# Patient Record
Sex: Male | Born: 1985 | Race: White | Hispanic: No | Marital: Married | Smoking: Never smoker
Health system: Southern US, Community
[De-identification: ages and names within clinical notes are randomized; demographics above are authoritative.]

## PROBLEM LIST (undated history)

## (undated) ENCOUNTER — Ambulatory Visit: Source: Home / Self Care

## (undated) HISTORY — PX: LITHOTRIPSY: SUR834

---
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
## 2011-05-20 ENCOUNTER — Other Ambulatory Visit (HOSPITAL_COMMUNITY): Payer: Self-pay | Admitting: Gastroenterology

## 2011-05-30 ENCOUNTER — Other Ambulatory Visit (HOSPITAL_COMMUNITY): Payer: Self-pay

## 2011-05-30 ENCOUNTER — Ambulatory Visit (HOSPITAL_COMMUNITY)
Admission: RE | Admit: 2011-05-30 | Discharge: 2011-05-30 | Disposition: A | Discharge status: Home / Self Care | Payer: 59 | Source: Ambulatory Visit | Attending: Gastroenterology | Admitting: Gastroenterology

## 2011-05-30 DIAGNOSIS — R109 Unspecified abdominal pain: Secondary | ICD-10-CM | POA: Insufficient documentation

## 2011-05-30 DIAGNOSIS — Q438 Other specified congenital malformations of intestine: Secondary | ICD-10-CM | POA: Insufficient documentation

## 2011-05-30 DIAGNOSIS — R197 Diarrhea, unspecified: Secondary | ICD-10-CM | POA: Insufficient documentation

## 2011-06-03 ENCOUNTER — Other Ambulatory Visit (HOSPITAL_COMMUNITY): Payer: Self-pay

## 2011-06-03 ENCOUNTER — Other Ambulatory Visit (HOSPITAL_COMMUNITY): Payer: Self-pay | Admitting: Gastroenterology

## 2011-06-03 ENCOUNTER — Ambulatory Visit (HOSPITAL_COMMUNITY)
Admission: RE | Admit: 2011-06-03 | Discharge: 2011-06-03 | Disposition: A | Discharge status: Home / Self Care | Payer: 59 | Source: Ambulatory Visit | Attending: Gastroenterology | Admitting: Gastroenterology

## 2011-06-03 DIAGNOSIS — R109 Unspecified abdominal pain: Secondary | ICD-10-CM | POA: Insufficient documentation

## 2011-06-03 DIAGNOSIS — R197 Diarrhea, unspecified: Secondary | ICD-10-CM | POA: Insufficient documentation

## 2011-06-03 DIAGNOSIS — K449 Diaphragmatic hernia without obstruction or gangrene: Secondary | ICD-10-CM | POA: Insufficient documentation

## 2016-03-30 DIAGNOSIS — J0100 Acute maxillary sinusitis, unspecified: Secondary | ICD-10-CM | POA: Diagnosis not present

## 2016-06-15 DIAGNOSIS — J069 Acute upper respiratory infection, unspecified: Secondary | ICD-10-CM | POA: Diagnosis not present

## 2016-06-15 DIAGNOSIS — R0789 Other chest pain: Secondary | ICD-10-CM | POA: Diagnosis not present

## 2017-06-10 DIAGNOSIS — Z20828 Contact with and (suspected) exposure to other viral communicable diseases: Secondary | ICD-10-CM | POA: Diagnosis not present

## 2017-07-09 DIAGNOSIS — J0100 Acute maxillary sinusitis, unspecified: Secondary | ICD-10-CM | POA: Diagnosis not present

## 2017-08-06 DIAGNOSIS — M25512 Pain in left shoulder: Secondary | ICD-10-CM | POA: Diagnosis not present

## 2017-11-26 DIAGNOSIS — J019 Acute sinusitis, unspecified: Secondary | ICD-10-CM | POA: Diagnosis not present

## 2018-01-28 ENCOUNTER — Emergency Department (HOSPITAL_COMMUNITY)
Admission: EM | Admit: 2018-01-28 | Discharge: 2018-01-28 | Disposition: A | Discharge status: Home / Self Care | Payer: 59 | Attending: Emergency Medicine | Admitting: Emergency Medicine

## 2018-01-28 ENCOUNTER — Emergency Department (HOSPITAL_COMMUNITY): Payer: 59

## 2018-01-28 ENCOUNTER — Encounter (HOSPITAL_COMMUNITY): Payer: Self-pay

## 2018-01-28 DIAGNOSIS — N200 Calculus of kidney: Secondary | ICD-10-CM

## 2018-01-28 DIAGNOSIS — N202 Calculus of kidney with calculus of ureter: Secondary | ICD-10-CM | POA: Diagnosis not present

## 2018-01-28 DIAGNOSIS — R109 Unspecified abdominal pain: Secondary | ICD-10-CM | POA: Diagnosis present

## 2018-01-28 LAB — URINALYSIS, ROUTINE W REFLEX MICROSCOPIC
BILIRUBIN URINE: NEGATIVE
Glucose, UA: NEGATIVE mg/dL
KETONES UR: NEGATIVE mg/dL
LEUKOCYTES UA: NEGATIVE
NITRITE: NEGATIVE
PROTEIN: NEGATIVE mg/dL
SPECIFIC GRAVITY, URINE: 1.012 (ref 1.005–1.030)
pH: 7 (ref 5.0–8.0)

## 2018-01-28 LAB — I-STAT CHEM 8, ED
BUN: 19 mg/dL (ref 6–20)
CALCIUM ION: 1.17 mmol/L (ref 1.15–1.40)
Chloride: 107 mmol/L (ref 98–111)
Creatinine, Ser: 1.1 mg/dL (ref 0.61–1.24)
Glucose, Bld: 117 mg/dL — ABNORMAL HIGH (ref 70–99)
HEMATOCRIT: 42 % (ref 39.0–52.0)
HEMOGLOBIN: 14.3 g/dL (ref 13.0–17.0)
Potassium: 3.9 mmol/L (ref 3.5–5.1)
Sodium: 141 mmol/L (ref 135–145)
TCO2: 27 mmol/L (ref 22–32)

## 2018-01-28 MED ORDER — HYDROCODONE-ACETAMINOPHEN 5-325 MG PO TABS: 1.0000 | ORAL_TABLET | ORAL | 0 refills | Status: DC | PRN

## 2018-01-28 MED ORDER — TAMSULOSIN HCL 0.4 MG PO CAPS: 0.4000 mg | ORAL_CAPSULE | Freq: Every day | ORAL | 0 refills | Status: DC

## 2018-01-28 MED ORDER — ONDANSETRON 4 MG PO TBDP: ORAL_TABLET | ORAL | 0 refills | Status: DC

## 2018-01-28 NOTE — ED Notes (Addendum)
Pt aware of need for urine sample, urinal at bedside 

## 2018-01-28 NOTE — ED Triage Notes (Signed)
Onset 2 weeks intermittant left flank radiating to left abd into testicle.  No vomiting.  Heating pad relieves pain.  Sent from u/c for CT scan to r/o stones.  Took Advil x 4 one hour PTA, pain is relieved.

## 2018-01-28 NOTE — ED Provider Notes (Signed)
[facility] EMERGENCY DEPARTMENT Provider Note   CSN: [id number] Arrival date & time: 01/28/18  1131     History   Chief Complaint Chief Complaint  Patient presents with  . Flank Pain    HPI Brad Edwards is a 32 y.o. male.  Patient is a 33 year old male who presents with flank pain.  He states about 2 weeks ago he had some pain in his left back radiating to his left flank.  He states that resolved with heating pad.  It was coming and going for about 2 days but then went away.  He states that came back this morning with a vengeance.  He had pain that woke him up from sleep this morning in his left back radiating to his left mid and lower abdomen.  Also radiates to his left testicle.  He has no difficulty urinating.  No nausea or vomiting.  No fevers.  He went to an urgent care in [place] and was noted to have blood in his urine which is concerning for a kidney stone.  He was sent here for further evaluation.  He states that he took 4 ibuprofen prior to arrival and his pain has resolved since then.     History reviewed. No pertinent past medical history.  There are no active problems to display for this patient.   History reviewed. No pertinent surgical history.      Home Medications    Prior to Admission medications   Medication Sig Start Date End Date Taking? Authorizing Provider  HYDROcodone-acetaminophen (NORCO/VICODIN) 5-325 MG tablet Take 1-2 tablets by mouth every 4 (four) hours as needed. 01/28/18   Rolan Bucco, MD  ondansetron (ZOFRAN ODT) 4 MG disintegrating tablet 4mg  ODT q4 hours prn nausea/vomit 01/28/18   Rolan Bucco, MD  tamsulosin (FLOMAX) 0.4 MG CAPS capsule Take 1 capsule (0.4 mg total) by mouth daily. 01/28/18   Rolan Bucco, MD    Family History History reviewed. No pertinent family history.  Social History Social History   Tobacco Use  . Smoking status: Never Smoker  . Smokeless tobacco: Never Used  Substance Use  Topics  . Alcohol use: Yes    Comment: occ  . Drug use: Never     Allergies   Patient has no known allergies.   Review of Systems Review of Systems  Constitutional: Negative for chills, diaphoresis, fatigue and fever.  HENT: Negative for congestion, rhinorrhea and sneezing.   Eyes: Negative.   Respiratory: Negative for cough, chest tightness and shortness of breath.   Cardiovascular: Negative for chest pain and leg swelling.  Gastrointestinal: Positive for abdominal pain. Negative for blood in stool, diarrhea, nausea and vomiting.  Genitourinary: Negative for difficulty urinating, flank pain, frequency and hematuria.  Musculoskeletal: Negative for arthralgias and back pain.  Skin: Negative for rash.  Neurological: Negative for dizziness, speech difficulty, weakness, numbness and headaches.     Physical Exam Updated Vital Signs BP 128/90   Pulse (!) 101   Temp 97.9 F (36.6 C) (Oral)   Resp 16   SpO2 97%   Physical Exam  Constitutional: He is oriented to person, place, and time. He appears well-developed and well-nourished.  HENT:  Head: Normocephalic and atraumatic.  Eyes: Pupils are equal, round, and reactive to light.  Neck: Normal range of motion. Neck supple.  Cardiovascular: Normal rate, regular rhythm and normal heart sounds.  Pulmonary/Chest: Effort normal and breath sounds normal. No respiratory distress. He has no wheezes. He has no rales.  He exhibits no tenderness.  Abdominal: Soft. Bowel sounds are normal. There is tenderness (Mild tenderness to the left lower abdomen). There is no rebound and no guarding.  Genitourinary:  Genitourinary Comments: No pain on palpation of the left testicle or inguinal area  Musculoskeletal: Normal range of motion. He exhibits no edema.  Lymphadenopathy:    He has no cervical adenopathy.  Neurological: He is alert and oriented to person, place, and time.  Skin: Skin is warm and dry. No rash noted.  Psychiatric: He has a  normal mood and affect.     ED Treatments / Results  Labs (all labs ordered are listed, but only abnormal results are displayed) Labs Reviewed  URINALYSIS, ROUTINE W REFLEX MICROSCOPIC - Abnormal; Notable for the following components:      Result Value   Hgb urine dipstick MODERATE (*)    Bacteria, UA RARE (*)    All other components within normal limits  I-STAT CHEM 8, ED - Abnormal; Notable for the following components:   Glucose, Bld 117 (*)    All other components within normal limits    EKG None  Radiology Ct Renal Stone Study  Result Date: 01/28/2018 CLINICAL DATA:  Intermittent left flank pain over the last 2 weeks. EXAM: CT ABDOMEN AND PELVIS WITHOUT CONTRAST TECHNIQUE: Multidetector CT imaging of the abdomen and pelvis was performed following the standard protocol without IV contrast. COMPARISON:  None. FINDINGS: Lower chest: Normal Hepatobiliary: Normal Pancreas: Normal Spleen: Normal Adrenals/Urinary Tract: Adrenal glands are normal. Right kidney is normal. Left kidney shows mild swelling and hydroureteronephrosis to the level L4 where there is a 4 mm stone within the left ureter. There seems to be a second 2 mm stone in the distal left ureter a few mm proximal to the UVJ. No stone in the bladder. Stomach/Bowel: Normal Vascular/Lymphatic: Normal Reproductive: Normal Other: No free fluid or air. Musculoskeletal: Normal IMPRESSION: 4 mm stone in the midportion of the left ureter with mild left hydroureteronephrosis. Second 2 mm stone suspected in the distal left ureter just proximal to the UVJ. Electronically Signed   By: Paulina Fusi M.D.   On: 01/28/2018 12:49    Procedures Procedures (including critical care time)  Medications Ordered in ED Medications - No data to display   Initial Impression / Assessment and Plan / ED Course  I have reviewed the triage vital signs and the nursing notes.  Pertinent labs & imaging results that were available during my care of the  patient were reviewed by me and considered in my medical decision making (see chart for details).     Patient is a 32 year old male who presents with flank pain.  He has evidence of 4 mm left ureteral stone in the mid ureter as well as a smaller 2 mm stone behind that.  His creatinine is normal.  His pain is well controlled and has not required any pain medication in the ED.  He has no fever or suggestions of infection.  He was discharged home in good condition.  He was given a referral to follow-up with [facility].  He was given prescriptions for Vicodin, Flomax and Zofran.  Return precautions were given.  Final Clinical Impressions(s) / ED Diagnoses   Final diagnoses:  Kidney stone    ED Discharge Orders         Ordered    HYDROcodone-acetaminophen (NORCO/VICODIN) 5-325 MG tablet  Every 4 hours PRN     01/28/18 1352    ondansetron (ZOFRAN ODT)  4 MG disintegrating tablet     01/28/18 1352    tamsulosin (FLOMAX) 0.4 MG CAPS capsule  Daily     01/28/18 1352           Rolan Bucco, MD 01/28/18 1353

## 2018-12-31 ENCOUNTER — Other Ambulatory Visit: Payer: Self-pay

## 2018-12-31 DIAGNOSIS — Z20822 Contact with and (suspected) exposure to covid-19: Secondary | ICD-10-CM

## 2019-01-01 LAB — NOVEL CORONAVIRUS, NAA: SARS-CoV-2, NAA: NOT DETECTED

## 2020-05-05 IMAGING — CT CT RENAL STONE PROTOCOL
2 of 4 series · 16 of 46 positions shown, 18 images · non-contrast
Comparison: None.

CLINICAL DATA: Intermittent left flank pain over the last 2 weeks.

EXAM:
CT ABDOMEN AND PELVIS WITHOUT CONTRAST
TECHNIQUE: Multidetector CT imaging of the abdomen and pelvis was performed
following the standard protocol without IV contrast.

[Series 3: stone study 5.0 i30f 2 · axial · 0.98mm/px · z∈[+684,+1219]mm · 13 of 117 slices shown, 15 images]
[im 5/117  soft-tissue]
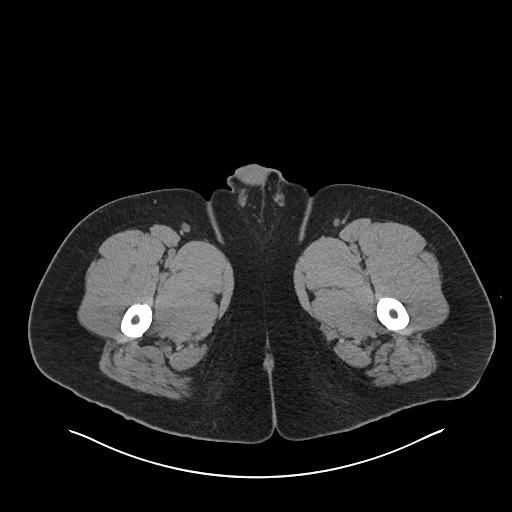
[im 5/117  bone]
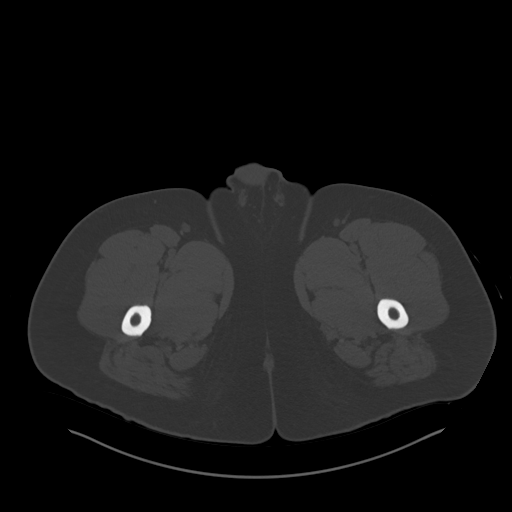
[im 14/117  soft-tissue]
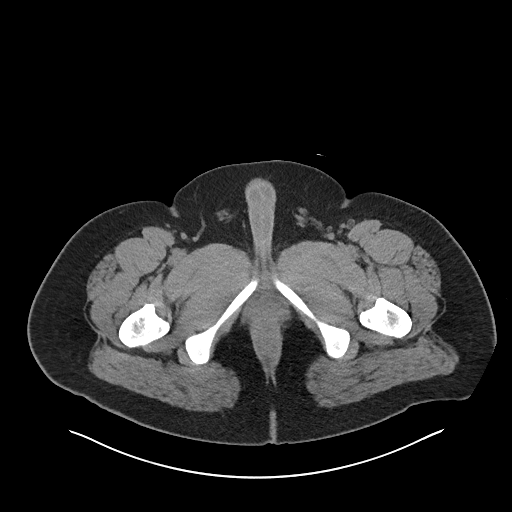
[im 24/117  soft-tissue]
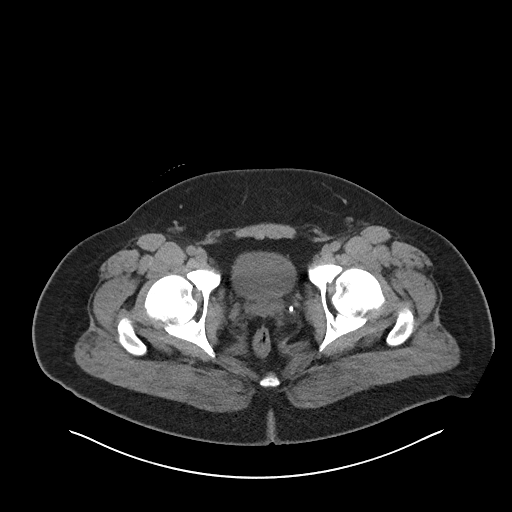
[im 33/117  soft-tissue]
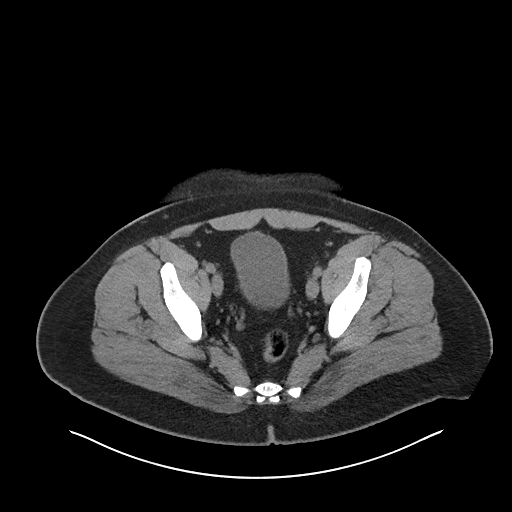
[im 42/117  soft-tissue]
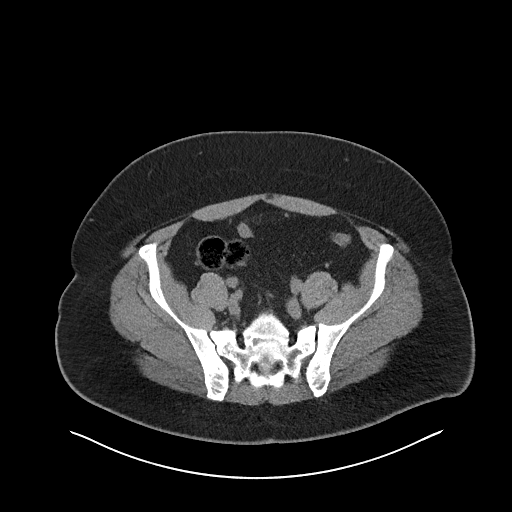
[im 52/117  soft-tissue]
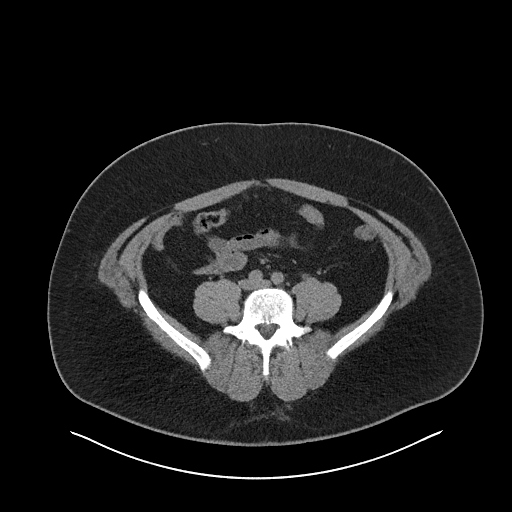
[im 61/117  soft-tissue]
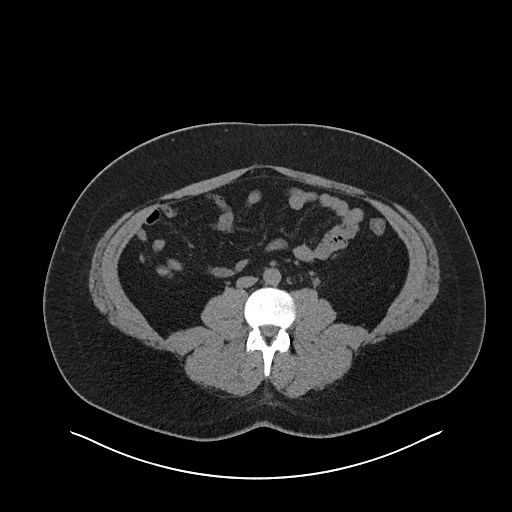
[im 65/117  soft-tissue]
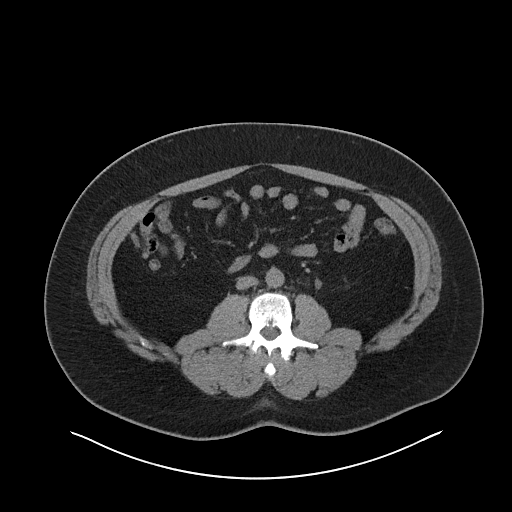
[im 75/117  soft-tissue]
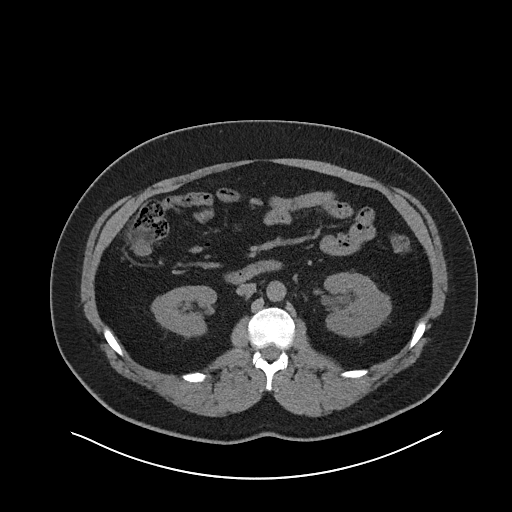
[im 75/117  bone]
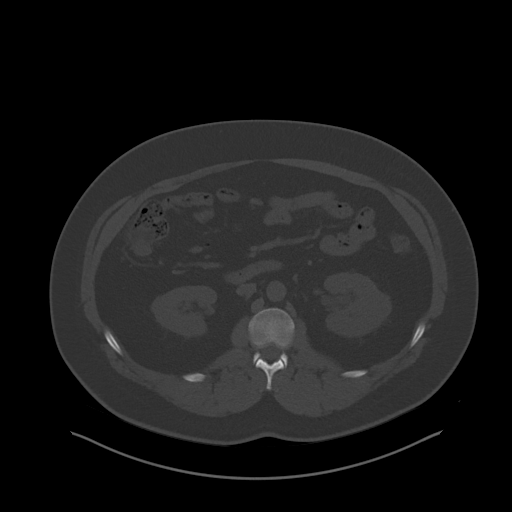
[im 84/117  soft-tissue]
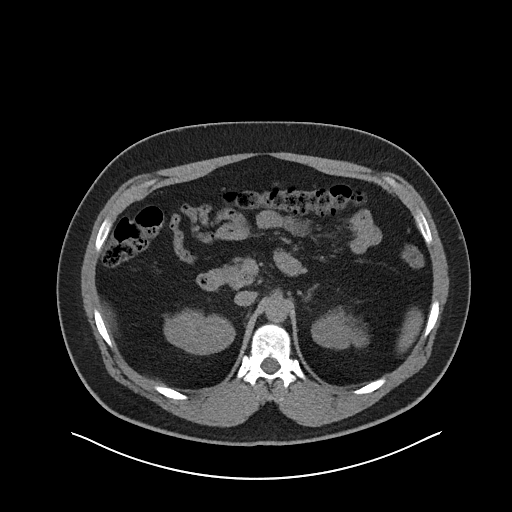
[im 93/117  soft-tissue]
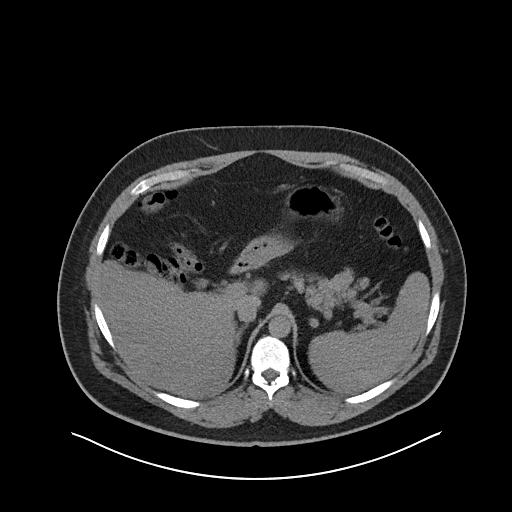
[im 103/117  soft-tissue]
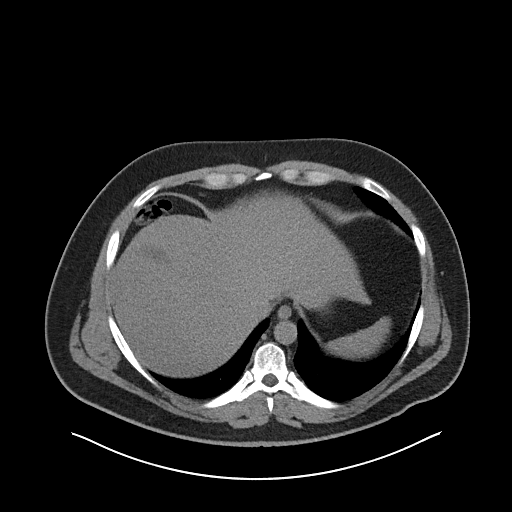
[im 112/117  soft-tissue]
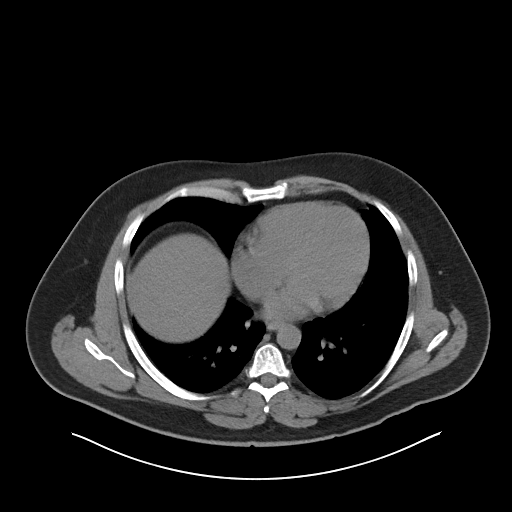

[Series 6: coronal soft tissue · coronal · 0.95mm/px · 3 of 125 slices shown]
[im 42/125  soft-tissue]
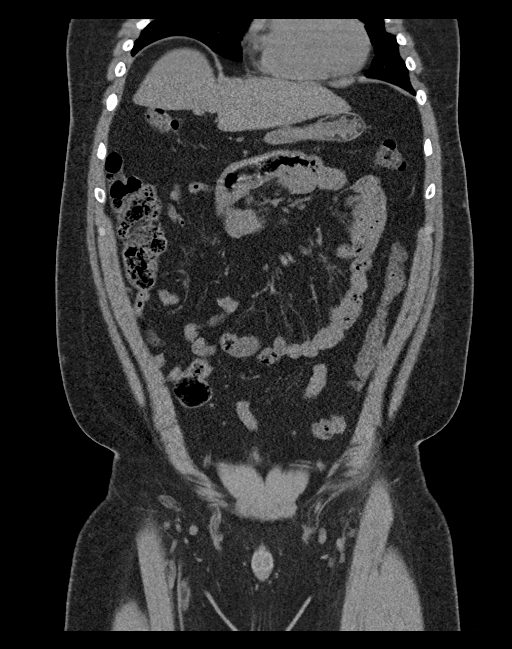
[im 56/125  soft-tissue]
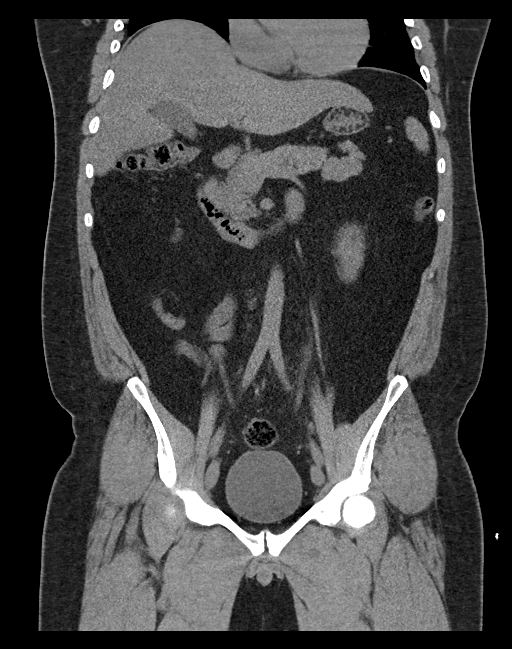
[im 69/125  soft-tissue]
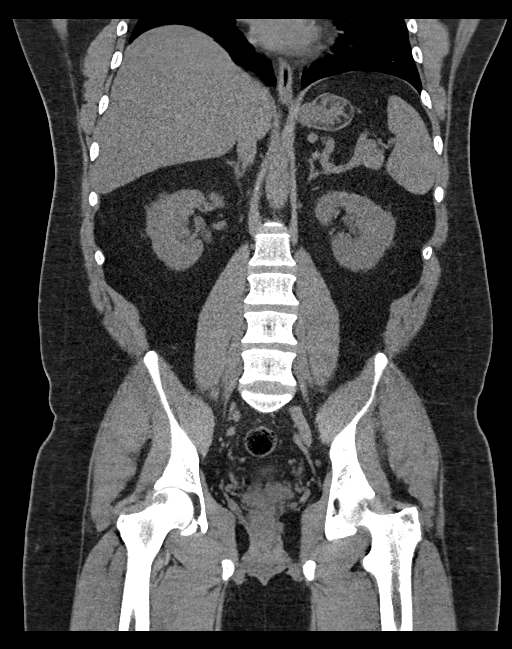

[16 of 46 positions shown; findings below may reference images not displayed]

FINDINGS: Lower chest: Normal

Hepatobiliary: Normal

Pancreas: Normal

Spleen: Normal

Adrenals/Urinary Tract: Adrenal glands are normal. Right kidney is
normal. Left kidney shows mild swelling and hydroureteronephrosis to
the level L4 where there is a 4 mm stone within the left ureter.
There seems to be a second 2 mm stone in the distal left ureter a
few mm proximal to the UVJ. No stone in the bladder.

Stomach/Bowel: Normal

Vascular/Lymphatic: Normal

Reproductive: Normal

Other: No free fluid or air.

Musculoskeletal: Normal
IMPRESSION: 4 mm stone in the midportion of the left ureter with mild left
hydroureteronephrosis. Second 2 mm stone suspected in the distal
left ureter just proximal to the UVJ.

## 2023-01-04 ENCOUNTER — Other Ambulatory Visit: Payer: Self-pay

## 2023-01-04 ENCOUNTER — Encounter (HOSPITAL_COMMUNITY): Payer: Self-pay

## 2023-01-04 ENCOUNTER — Emergency Department (HOSPITAL_COMMUNITY)
Admission: EM | Admit: 2023-01-04 | Discharge: 2023-01-04 | Disposition: A | Discharge status: Home / Self Care | Payer: 59 | Attending: Emergency Medicine | Admitting: Emergency Medicine

## 2023-01-04 DIAGNOSIS — Z20822 Contact with and (suspected) exposure to covid-19: Secondary | ICD-10-CM | POA: Insufficient documentation

## 2023-01-04 DIAGNOSIS — R300 Dysuria: Secondary | ICD-10-CM | POA: Insufficient documentation

## 2023-01-04 DIAGNOSIS — J069 Acute upper respiratory infection, unspecified: Secondary | ICD-10-CM | POA: Insufficient documentation

## 2023-01-04 DIAGNOSIS — R509 Fever, unspecified: Secondary | ICD-10-CM | POA: Diagnosis present

## 2023-01-04 LAB — URINALYSIS, ROUTINE W REFLEX MICROSCOPIC
Bacteria, UA: NONE SEEN
Bilirubin Urine: NEGATIVE
Glucose, UA: NEGATIVE mg/dL
Hgb urine dipstick: NEGATIVE
Ketones, ur: 20 mg/dL — AB
Leukocytes,Ua: NEGATIVE
Nitrite: NEGATIVE
Protein, ur: 30 mg/dL — AB
Specific Gravity, Urine: 1.021 (ref 1.005–1.030)
pH: 7 (ref 5.0–8.0)

## 2023-01-04 LAB — I-STAT CHEM 8, ED
BUN: 13 mg/dL (ref 6–20)
Calcium, Ion: 1.24 mmol/L (ref 1.15–1.40)
Chloride: 103 mmol/L (ref 98–111)
Creatinine, Ser: 1 mg/dL (ref 0.61–1.24)
Glucose, Bld: 98 mg/dL (ref 70–99)
HCT: 40 % (ref 39.0–52.0)
Hemoglobin: 13.6 g/dL (ref 13.0–17.0)
Potassium: 4.1 mmol/L (ref 3.5–5.1)
Sodium: 138 mmol/L (ref 135–145)
TCO2: 24 mmol/L (ref 22–32)

## 2023-01-04 LAB — SARS CORONAVIRUS 2 BY RT PCR: SARS Coronavirus 2 by RT PCR: NEGATIVE

## 2023-01-04 MED ORDER — ONDANSETRON 4 MG PO TBDP: ORAL_TABLET | ORAL | 0 refills | Status: AC

## 2023-01-04 MED ORDER — SULFAMETHOXAZOLE-TRIMETHOPRIM 800-160 MG PO TABS: 1.0000 | ORAL_TABLET | Freq: Two times a day (BID) | ORAL | 0 refills | Status: DC

## 2023-01-04 MED ORDER — BENZONATATE 100 MG PO CAPS: 100.0000 mg | ORAL_CAPSULE | Freq: Three times a day (TID) | ORAL | 0 refills | Status: DC

## 2023-01-04 MED ORDER — ACETAMINOPHEN 500 MG PO TABS
1000.0000 mg | ORAL_TABLET | Freq: Once | ORAL | Status: AC
  Administered 2023-01-04: 1000 mg via ORAL
  Filled 2023-01-04: qty 2

## 2023-01-04 NOTE — ED Provider Notes (Signed)
[place] EMERGENCY DEPARTMENT AT [facility] Provider Note   CSN: [id number] Arrival date & time: 01/04/23  1421     History  Chief Complaint  Patient presents with   Dysuria    Brad Edwards is a 37 y.o. male.  37 yo M with a chief complaints of foul-smelling and cloudy urine and dysuria.  This has been off and on for the past few days.  He started having fevers and chills and so had a telehealth visit.  They were concerned of possible pyelonephritis.  Per their protocol they do not typically treat presumptively for males and he was encouraged to come to the ED for evaluation.  He does have a history of kidney stones and does not think this feels like the same.  He has been eating and drinking a bit less than normal.  He feels like he has to urinate all the time.Marland Kitchen  His kids have been sick with upper respiratory illnesses.  Both have been on antibiotics and are just about finished.   Dysuria Presenting symptoms: dysuria        Home Medications Prior to Admission medications   Medication Sig Start Date End Date Taking? Authorizing Provider  benzonatate (TESSALON) 100 MG capsule Take 1 capsule (100 mg total) by mouth every 8 (eight) hours. 01/04/23  Yes Melene Plan, DO  ondansetron (ZOFRAN-ODT) 4 MG disintegrating tablet 4mg  ODT q4 hours prn nausea/vomit 01/04/23  Yes Melene Plan, DO  sulfamethoxazole-trimethoprim (BACTRIM DS) 800-160 MG tablet Take 1 tablet by mouth 2 (two) times daily for 7 days. 01/04/23 01/11/23 Yes Melene Plan, DO  HYDROcodone-acetaminophen (NORCO/VICODIN) 5-325 MG tablet Take 1-2 tablets by mouth every 4 (four) hours as needed. 01/28/18   Rolan Bucco, MD  tamsulosin (FLOMAX) 0.4 MG CAPS capsule Take 1 capsule (0.4 mg total) by mouth daily. 01/28/18   Rolan Bucco, MD      Allergies    Patient has no known allergies.    Review of Systems   Review of Systems  Genitourinary:  Positive for dysuria.    Physical Exam Updated Vital Signs BP  119/68 (BP Location: Left Arm)   Pulse 95   Temp (!) 100.8 F (38.2 C) (Oral)   Resp 17   Ht 6' (1.829 m)   Wt 133.8 kg   SpO2 96%   BMI 40.01 kg/m  Physical Exam Vitals and nursing note reviewed.  Constitutional:      Appearance: He is well-developed.  HENT:     Head: Normocephalic and atraumatic.     Comments: Swollen turbinates, posterior nasal drip, no noted sinus ttp, tm normal bilaterally.   Eyes:     Pupils: Pupils are equal, round, and reactive to light.  Neck:     Vascular: No JVD.  Cardiovascular:     Rate and Rhythm: Normal rate and regular rhythm.     Heart sounds: No murmur heard.    No friction rub. No gallop.  Pulmonary:     Effort: No respiratory distress.     Breath sounds: No wheezing.  Abdominal:     General: There is no distension.     Tenderness: There is no abdominal tenderness. There is no right CVA tenderness, left CVA tenderness, guarding or rebound.  Musculoskeletal:        General: Normal range of motion.     Cervical back: Normal range of motion and neck supple.  Skin:    Coloration: Skin is not pale.     Findings:  No rash.  Neurological:     Mental Status: He is alert and oriented to person, place, and time.  Psychiatric:        Behavior: Behavior normal.     ED Results / Procedures / Treatments   Labs (all labs ordered are listed, but only abnormal results are displayed) Labs Reviewed  URINALYSIS, ROUTINE W REFLEX MICROSCOPIC - Abnormal; Notable for the following components:      Result Value   Ketones, ur 20 (*)    Protein, ur 30 (*)    All other components within normal limits  SARS CORONAVIRUS 2 BY RT PCR  I-STAT CHEM 8, ED    EKG None  Radiology No results found.  Procedures Procedures    Medications Ordered in ED Medications  acetaminophen (TYLENOL) tablet 1,000 mg (1,000 mg Oral Given 01/04/23 1827)    ED Course/ Medical Decision Making/ A&P                                 Medical Decision Making Amount  and/or Complexity of Data Reviewed Labs: ordered.  Risk OTC drugs. Prescription drug management.   37 year old male with a chief complaints of foul-smelling urine dysuria increased frequency fevers chills and myalgias cough and congestion.  This has been going on for a few days now.  His urine here is not obviously infected.  He has no whites or reds.  No bacteria.  My initial presumption was that he was dehydrated with decreased oral intake from an upper respiratory illness disease been exposed 1 recently by both of his children however he states that he has been having really frequent and large amounts of urine.  I will obtain a screening i-STAT Chem-8 to assess for new hyperglycemia or new acute renal dysfunction.  I-STAT Chem-8 without any abnormality, renal function normal, blood sugar is 98, no significant electrolyte abnormality.  No anemia.  Discharged home.  PCP follow-up.  8:57 PM:  I have discussed the diagnosis/risks/treatment options with the patient and family.  Evaluation and diagnostic testing in the emergency department does not suggest an emergent condition requiring admission or immediate intervention beyond what has been performed at this time.  They will follow up with PCP. We also discussed returning to the ED immediately if new or worsening sx occur. We discussed the sx which are most concerning (e.g., sudden worsening pain, fever, inability to tolerate by mouth) that necessitate immediate return. Medications administered to the patient during their visit and any new prescriptions provided to the patient are listed below.  Medications given during this visit Medications  acetaminophen (TYLENOL) tablet 1,000 mg (1,000 mg Oral Given 01/04/23 1827)     The patient appears reasonably screen and/or stabilized for discharge and I doubt any other medical condition or other [facility] requiring further screening, evaluation, or treatment in the ED at this time prior to discharge.           Final Clinical Impression(s) / ED Diagnoses Final diagnoses:  Viral URI with cough  Dysuria    Rx / DC Orders ED Discharge Orders          Ordered    sulfamethoxazole-trimethoprim (BACTRIM DS) 800-160 MG tablet  2 times daily        01/04/23 1916    benzonatate (TESSALON) 100 MG capsule  Every 8 hours        01/04/23 1917    ondansetron (ZOFRAN-ODT) 4 MG disintegrating  tablet        01/04/23 1917              Melene Plan, DO 01/04/23 2058

## 2023-01-04 NOTE — Discharge Instructions (Signed)
Your labs look great!  No signs of kidney dysfunction or high blood sugar.  Most likely you have a viral syndrome.  With your urinary symptoms I am starting you on antibiotics.  Please follow-up with your family doctor in the office.

## 2023-01-04 NOTE — ED Triage Notes (Signed)
Pt states he has had foul smelling urine, cloudy urine, intermittent burning with urination, urinary frequency, nasal congestion, fevers, and cough that started last week. Pt denies discharge or skin lesions.

## 2023-01-08 ENCOUNTER — Emergency Department (HOSPITAL_BASED_OUTPATIENT_CLINIC_OR_DEPARTMENT_OTHER): Payer: 59

## 2023-01-08 ENCOUNTER — Encounter (HOSPITAL_BASED_OUTPATIENT_CLINIC_OR_DEPARTMENT_OTHER): Payer: Self-pay

## 2023-01-08 ENCOUNTER — Other Ambulatory Visit: Payer: Self-pay

## 2023-01-08 ENCOUNTER — Inpatient Hospital Stay (HOSPITAL_BASED_OUTPATIENT_CLINIC_OR_DEPARTMENT_OTHER)
Admission: EM | Admit: 2023-01-08 | Discharge: 2023-01-11 | DRG: 194 | Disposition: A | Discharge status: Home / Self Care | Payer: 59 | Attending: Internal Medicine | Admitting: Internal Medicine

## 2023-01-08 DIAGNOSIS — D649 Anemia, unspecified: Secondary | ICD-10-CM | POA: Diagnosis present

## 2023-01-08 DIAGNOSIS — Z6841 Body Mass Index (BMI) 40.0 and over, adult: Secondary | ICD-10-CM | POA: Diagnosis not present

## 2023-01-08 DIAGNOSIS — E876 Hypokalemia: Secondary | ICD-10-CM | POA: Diagnosis present

## 2023-01-08 DIAGNOSIS — J189 Pneumonia, unspecified organism: Secondary | ICD-10-CM | POA: Diagnosis present

## 2023-01-08 DIAGNOSIS — R0902 Hypoxemia: Secondary | ICD-10-CM | POA: Diagnosis present

## 2023-01-08 DIAGNOSIS — G4733 Obstructive sleep apnea (adult) (pediatric): Secondary | ICD-10-CM | POA: Diagnosis present

## 2023-01-08 DIAGNOSIS — E66813 Obesity, class 3: Secondary | ICD-10-CM | POA: Diagnosis present

## 2023-01-08 DIAGNOSIS — J157 Pneumonia due to Mycoplasma pneumoniae: Principal | ICD-10-CM | POA: Diagnosis present

## 2023-01-08 DIAGNOSIS — Z87442 Personal history of urinary calculi: Secondary | ICD-10-CM | POA: Diagnosis not present

## 2023-01-08 DIAGNOSIS — Z79899 Other long term (current) drug therapy: Secondary | ICD-10-CM | POA: Diagnosis not present

## 2023-01-08 DIAGNOSIS — Z1152 Encounter for screening for COVID-19: Secondary | ICD-10-CM | POA: Diagnosis not present

## 2023-01-08 DIAGNOSIS — R809 Proteinuria, unspecified: Secondary | ICD-10-CM | POA: Diagnosis present

## 2023-01-08 DIAGNOSIS — J9601 Acute respiratory failure with hypoxia: Principal | ICD-10-CM

## 2023-01-08 DIAGNOSIS — N3000 Acute cystitis without hematuria: Secondary | ICD-10-CM | POA: Diagnosis not present

## 2023-01-08 DIAGNOSIS — N390 Urinary tract infection, site not specified: Secondary | ICD-10-CM | POA: Diagnosis present

## 2023-01-08 DIAGNOSIS — R509 Fever, unspecified: Secondary | ICD-10-CM | POA: Diagnosis present

## 2023-01-08 LAB — BASIC METABOLIC PANEL
Anion gap: 11 (ref 5–15)
BUN: 14 mg/dL (ref 6–20)
CO2: 26 mmol/L (ref 22–32)
Calcium: 9.7 mg/dL (ref 8.9–10.3)
Chloride: 99 mmol/L (ref 98–111)
Creatinine, Ser: 0.9 mg/dL (ref 0.61–1.24)
GFR, Estimated: >60 mL/min (ref >60)
Glucose, Bld: 121 mg/dL — ABNORMAL HIGH (ref 70–99)
Potassium: 3.4 mmol/L — ABNORMAL LOW (ref 3.5–5.1)
Sodium: 136 mmol/L (ref 135–145)

## 2023-01-08 LAB — URINALYSIS, ROUTINE W REFLEX MICROSCOPIC
Bacteria, UA: NONE SEEN
Glucose, UA: NEGATIVE mg/dL
Hgb urine dipstick: NEGATIVE
Ketones, ur: NEGATIVE mg/dL
Leukocytes,Ua: NEGATIVE
Nitrite: NEGATIVE
Protein, ur: 100 mg/dL — AB
Specific Gravity, Urine: 1.042 — ABNORMAL HIGH (ref 1.005–1.030)
pH: 6.5 (ref 5.0–8.0)

## 2023-01-08 LAB — I-STAT ARTERIAL BLOOD GAS, ED
Acid-Base Excess: 0 mmol/L (ref 0.0–2.0)
Bicarbonate: 24.6 mmol/L (ref 20.0–28.0)
Calcium, Ion: 1.19 mmol/L (ref 1.15–1.40)
HCT: 37 % — ABNORMAL LOW (ref 39.0–52.0)
Hemoglobin: 12.6 g/dL — ABNORMAL LOW (ref 13.0–17.0)
O2 Saturation: 92 %
Patient temperature: 98.7
Potassium: 3.4 mmol/L — ABNORMAL LOW (ref 3.5–5.1)
Sodium: 134 mmol/L — ABNORMAL LOW (ref 135–145)
TCO2: 26 mmol/L (ref 22–32)
pCO2 arterial: 37.8 mm[Hg] (ref 32–48)
pH, Arterial: 7.42 (ref 7.35–7.45)
pO2, Arterial: 62 mm[Hg] — ABNORMAL LOW (ref 83–108)

## 2023-01-08 LAB — D-DIMER, QUANTITATIVE: D-Dimer, Quant: 2.19 ug{FEU}/mL — ABNORMAL HIGH (ref 0.00–0.50)

## 2023-01-08 LAB — CBC WITH DIFFERENTIAL/PLATELET
Abs Immature Granulocytes: 0.1 10*3/uL — ABNORMAL HIGH (ref 0.00–0.07)
Basophils Absolute: 0 10*3/uL (ref 0.0–0.1)
Basophils Relative: 0 %
Eosinophils Absolute: 0.1 10*3/uL (ref 0.0–0.5)
Eosinophils Relative: 1 %
HCT: 39 % (ref 39.0–52.0)
Hemoglobin: 12.9 g/dL — ABNORMAL LOW (ref 13.0–17.0)
Immature Granulocytes: 1 %
Lymphocytes Relative: 11 %
Lymphs Abs: 1 10*3/uL (ref 0.7–4.0)
MCH: 28.7 pg (ref 26.0–34.0)
MCHC: 33.1 g/dL (ref 30.0–36.0)
MCV: 86.9 fL (ref 80.0–100.0)
Monocytes Absolute: 1 10*3/uL (ref 0.1–1.0)
Monocytes Relative: 11 %
Neutro Abs: 7.1 10*3/uL (ref 1.7–7.7)
Neutrophils Relative %: 76 %
Platelets: 243 10*3/uL (ref 150–400)
RBC: 4.49 MIL/uL (ref 4.22–5.81)
RDW: 15.4 % (ref 11.5–15.5)
WBC: 9.2 10*3/uL (ref 4.0–10.5)
nRBC: 0 % (ref 0.0–0.2)

## 2023-01-08 LAB — TROPONIN I (HIGH SENSITIVITY)
Troponin I (High Sensitivity): 4 ng/L (ref <18)
Troponin I (High Sensitivity): 4 ng/L (ref <18)

## 2023-01-08 LAB — RESP PANEL BY RT-PCR (RSV, FLU A&B, COVID)  RVPGX2
Influenza A by PCR: NEGATIVE
Influenza B by PCR: NEGATIVE
Resp Syncytial Virus by PCR: NEGATIVE
SARS Coronavirus 2 by RT PCR: NEGATIVE

## 2023-01-08 LAB — LACTIC ACID, PLASMA: Lactic Acid, Venous: 1.4 mmol/L (ref 0.5–1.9)

## 2023-01-08 MED ORDER — ACETAMINOPHEN 325 MG PO TABS
650.0000 mg | ORAL_TABLET | Freq: Four times a day (QID) | ORAL | Status: DC | PRN
  Administered 2023-01-09: 650 mg via ORAL
  Filled 2023-01-08: qty 2

## 2023-01-08 MED ORDER — ONDANSETRON HCL 4 MG PO TABS
4.0000 mg | ORAL_TABLET | Freq: Four times a day (QID) | ORAL | Status: DC | PRN
Start: 2023-01-08 — End: 2023-01-11

## 2023-01-08 MED ORDER — METHYLPREDNISOLONE SODIUM SUCC 125 MG IJ SOLR
125.0000 mg | Freq: Once | INTRAMUSCULAR | Status: AC
  Administered 2023-01-08: 125 mg via INTRAVENOUS
  Filled 2023-01-08: qty 2

## 2023-01-08 MED ORDER — IOHEXOL 350 MG/ML SOLN
100.0000 mL | Freq: Once | INTRAVENOUS | Status: AC | PRN
  Administered 2023-01-08: 75 mL via INTRAVENOUS

## 2023-01-08 MED ORDER — SODIUM CHLORIDE 0.9 % IV SOLN
500.0000 mg | Freq: Once | INTRAVENOUS | Status: AC
  Administered 2023-01-08: 500 mg via INTRAVENOUS
  Filled 2023-01-08: qty 5

## 2023-01-08 MED ORDER — METHYLPREDNISOLONE SODIUM SUCC 125 MG IJ SOLR
INTRAMUSCULAR | Status: AC
  Filled 2023-01-08: qty 2

## 2023-01-08 MED ORDER — ENOXAPARIN SODIUM 60 MG/0.6ML IJ SOSY
60.0000 mg | PREFILLED_SYRINGE | INTRAMUSCULAR | Status: DC
Start: 2023-01-08 — End: 2023-01-11
  Administered 2023-01-08 – 2023-01-10 (×3): 60 mg via SUBCUTANEOUS
  Filled 2023-01-08 (×3): qty 0.6

## 2023-01-08 MED ORDER — IPRATROPIUM-ALBUTEROL 0.5-2.5 (3) MG/3ML IN SOLN
RESPIRATORY_TRACT | Status: AC
  Administered 2023-01-08: 3 mL via RESPIRATORY_TRACT
  Filled 2023-01-08: qty 3

## 2023-01-08 MED ORDER — SODIUM CHLORIDE 0.9 % IV SOLN
500.0000 mg | INTRAVENOUS | Status: DC
  Administered 2023-01-09: 500 mg via INTRAVENOUS
  Filled 2023-01-08: qty 5

## 2023-01-08 MED ORDER — MAGNESIUM SULFATE 2 GM/50ML IV SOLN
2.0000 g | Freq: Once | INTRAVENOUS | Status: AC
  Administered 2023-01-08: 2 g via INTRAVENOUS
  Filled 2023-01-08: qty 50

## 2023-01-08 MED ORDER — IPRATROPIUM-ALBUTEROL 0.5-2.5 (3) MG/3ML IN SOLN
3.0000 mL | Freq: Four times a day (QID) | RESPIRATORY_TRACT | Status: DC
  Administered 2023-01-08 – 2023-01-10 (×7): 3 mL via RESPIRATORY_TRACT
  Filled 2023-01-08 (×8): qty 3

## 2023-01-08 MED ORDER — ACETAMINOPHEN 650 MG RE SUPP: 650.0000 mg | Freq: Four times a day (QID) | RECTAL | Status: DC | PRN

## 2023-01-08 MED ORDER — POTASSIUM CHLORIDE CRYS ER 20 MEQ PO TBCR
40.0000 meq | EXTENDED_RELEASE_TABLET | Freq: Once | ORAL | Status: AC
  Administered 2023-01-08: 40 meq via ORAL
  Filled 2023-01-08: qty 2

## 2023-01-08 MED ORDER — IPRATROPIUM-ALBUTEROL 0.5-2.5 (3) MG/3ML IN SOLN: 3.0000 mL | Freq: Once | RESPIRATORY_TRACT | Status: AC

## 2023-01-08 MED ORDER — IPRATROPIUM-ALBUTEROL 0.5-2.5 (3) MG/3ML IN SOLN
3.0000 mL | RESPIRATORY_TRACT | Status: DC | PRN
  Administered 2023-01-08: 3 mL via RESPIRATORY_TRACT
  Filled 2023-01-08: qty 3

## 2023-01-08 MED ORDER — SODIUM CHLORIDE 0.9 % IV SOLN
2.0000 g | INTRAVENOUS | Status: DC
  Administered 2023-01-09: 2 g via INTRAVENOUS
  Filled 2023-01-08: qty 20

## 2023-01-08 MED ORDER — SODIUM CHLORIDE 0.9 % IV SOLN
1.0000 g | Freq: Once | INTRAVENOUS | Status: AC
  Administered 2023-01-08: 1 g via INTRAVENOUS
  Filled 2023-01-08: qty 10

## 2023-01-08 MED ORDER — ONDANSETRON HCL 4 MG/2ML IJ SOLN: 4.0000 mg | Freq: Four times a day (QID) | INTRAMUSCULAR | Status: DC | PRN

## 2023-01-08 NOTE — Progress Notes (Signed)
Plan of Care Note for accepted transfer   Patient: Brad Edwards MRN: [id number]   DOA: 01/08/2023  Facility requesting transfer: [facility]   Requesting Provider: Dr. Manus Gunning   Reason for transfer: Pneumonia with acute hypoxic respiratory failure   Facility course: 37 yr old male with BMI 40 who p/w fever, cough, and SOB. He was saturating in 80s on rm air and has multifocal pneumonia on imaging. Lactate is normal and BP stable.   He was treated with supplemental O2, Rocephin, and azithromycin.   Plan of care: The patient is accepted for admission to Telemetry unit, at [facility].   Author: Briscoe Deutscher, MD 01/08/2023  Check [email or web address] for on-call coverage.  Nursing staff, Please call [facility] Admits & Consults System-Wide number on Amion as soon as patient's arrival, so appropriate admitting provider can evaluate the pt.

## 2023-01-08 NOTE — ED Notes (Signed)
Pt up to urinate in the urinal, O2 sats dropped to 88%. Pt assisted into a gown and back to bed. O2 sats returned to low 90's once back in bed. Pt also reports frequent urination for over a week and states UA has been normal.

## 2023-01-08 NOTE — ED Notes (Signed)
Report given over the phone to [facility] with [facility]. ETA approximately 10 minutes.

## 2023-01-08 NOTE — Plan of Care (Signed)

## 2023-01-08 NOTE — H&P (Signed)
History and Physical    Patient: Brad Edwards ZOX:[id number] DOB: 08-01-1985 DOA: 01/08/2023 DOS: the patient was seen and examined on 01/08/2023 PCP: Patient, No Pcp Per  Patient coming from: Home  Chief Complaint:  Chief Complaint  Patient presents with   Fever   Cough   HPI: Brad Edwards is a 37 y.o. male with medical history significant of nephrolithiasis, lithotripsy,Class III obesity, aortic area who was seeing in the emergency department on Wednesday afternoon due to URI symptoms and UTI, but returned earlier this morning due to fever, fatigue, malaise, wheezing, productive cough and progressively worsening dyspnea. He denied hemoptysis.  No chest pain, palpitations, diaphoresis, PND, orthopnea or pitting edema of the lower extremities.  No abdominal pain, nausea, emesis, constipation, melena or hematochezia: But had diarrhea after starting prescribed Bactrim for UTI.  Significant frequency with mild dysuria, but no flank pain or hematuria.  No polyuria, polydipsia, polyphagia or blurred vision.   Lab work: Urinalysis with a specific gravity 1.042, small bilirubin and proteinuria 100 mg/deciliter.  Coronavirus, influenza and RSV PCR negative.  CBC showed a white count of 9.2 with 76% neutrophils, hemoglobin 12.9 g/dL platelets 811.  D-dimer 2.19.  Troponin x 2 normal.  Lactic acid 1.4 mmol/L.  BMP showed a potassium of 3.4 mmol/L and a glucose of 121 mg/dL.  Imaging: Portable 1 view chest radiograph with reticulonodular infiltrates bilaterally.  CTA chest showing multilobar pneumonia, but no pulmonary embolism.  ED course: Initial vital signs were temperature 98.7 F, pulse 98, respiration 20, BP 150/87 mmHg O2 sat 92% on room air but subsequently his oxygen requirement has increased to 6 L/min.  He received ceftriaxone and azithromycin.   Review of Systems: As mentioned in the history of present illness. All other systems reviewed and are negative. History reviewed. No pertinent  past medical history. Past Surgical History:  Procedure Laterality Date   LITHOTRIPSY     Social History:  reports that he has never smoked. He has never used smokeless tobacco. He reports current alcohol use. He reports that he does not use drugs.  No Known Allergies  History reviewed. No pertinent family history.  Prior to Admission medications   Medication Sig Start Date End Date Taking? Authorizing Provider  acetaminophen (TYLENOL) 500 MG tablet Take 500 mg by mouth every 6 (six) hours as needed for fever or moderate pain (pain score 4-6).   Yes [provider]  ibuprofen (ADVIL) 200 MG tablet Take 200 mg by mouth every 6 (six) hours as needed for fever or moderate pain (pain score 4-6).   Yes [provider]  ondansetron (ZOFRAN-ODT) 4 MG disintegrating tablet 4mg  ODT q4 hours prn nausea/vomit 01/04/23  Yes Melene Plan, DO  sulfamethoxazole-trimethoprim (BACTRIM DS) 800-160 MG tablet Take 1 tablet by mouth 2 (two) times daily for 7 days. 01/04/23 01/11/23 Yes Melene Plan, DO  benzonatate (TESSALON) 100 MG capsule Take 1 capsule (100 mg total) by mouth every 8 (eight) hours. Patient not taking: Reported on 01/08/2023 01/04/23   Melene Plan, DO    Physical Exam: Vitals:   01/08/23 1215 01/08/23 1245 01/08/23 1427 01/08/23 1556  BP: 137/85 130/77 135/87   Pulse: 97 97 99   Resp: 16 (!) 22 (!) 24   Temp:   99.4 F (37.4 C)   TempSrc:   Oral   SpO2: 92% 90% 93% 95%   Physical Exam Vitals reviewed.  Constitutional:      General: He is awake. He is not in acute  distress.    Appearance: He is morbidly obese. He is ill-appearing. He is not toxic-appearing.     Interventions: Nasal cannula in place.  HENT:     Head: Normocephalic.     Nose: No rhinorrhea.     Mouth/Throat:     Mouth: Mucous membranes are moist.  Eyes:     General: No scleral icterus.    Pupils: Pupils are equal, round, and reactive to light.  Neck:     Vascular: No JVD.  Cardiovascular:      Rate and Rhythm: Normal rate and regular rhythm.     Heart sounds: S1 normal and S2 normal.  Pulmonary:     Effort: No respiratory distress.     Breath sounds: Wheezing, rhonchi and rales present.  Abdominal:     General: Abdomen is protuberant. Bowel sounds are normal. There is no distension.     Palpations: Abdomen is soft.     Tenderness: There is no abdominal tenderness. There is no right CVA tenderness, left CVA tenderness or guarding.  Musculoskeletal:     Cervical back: Neck supple.     Right lower leg: No edema.     Left lower leg: No edema.  Skin:    General: Skin is warm and dry.  Neurological:     General: No focal deficit present.     Mental Status: He is alert and oriented to person, place, and time.  Psychiatric:        Mood and Affect: Mood normal.        Behavior: Behavior normal. Behavior is cooperative.    Follow home Data Reviewed:  Results are pending, will review when available.  EKG: Vent. rate 96 BPM PR interval 133 ms QRS duration 93 ms QT/QTcB 339/429 ms P-R-T axes 69 30 -1 Sinus rhythm Borderline T abnormalities, inferior leads Baseline wander in lead(s) I II aVR V2 V3  Assessment and Plan: Principal Problem:   Multifocal pneumonia Admit to PCU/inpatient. Continue supplemental oxygen. Scheduled and as needed bronchodilators. Continue ceftriaxone 2 g IVPB daily. Continue azithromycin 500 mg IVPB daily. Check strep pneumoniae urinary antigen. Check sputum Gram stain, culture and sensitivity. Follow-up blood culture and sensitivity. Follow-up CBC and chemistry in the morning.  Active Problems:   UTI (urinary tract infection) Hold Bactrim. Continue ceftriaxone as above. No urine culture results available.    Hypokalemia Replacing. Magnesium was supplemented.    Normocytic anemia Follow H&H in the morning.    Class 3 obesity Current BMI  kg/m. Lifestyle modifications. Follow-up with closely PCP and/or bariatric clinic.      Advance Care Planning:   Code Status: Full Code   Consults:   Family Communication:   Severity of Illness: The appropriate patient status for this patient is INPATIENT. Inpatient status is judged to be reasonable and necessary in order to provide the required intensity of service to ensure the patient's safety. The patient's presenting symptoms, physical exam findings, and initial radiographic and laboratory data in the context of their chronic comorbidities is felt to place them at high risk for further clinical deterioration. Furthermore, it is not anticipated that the patient will be medically stable for discharge from the hospital within 2 midnights of admission.   * I certify that at the point of admission it is my clinical judgment that the patient will require inpatient hospital care spanning beyond 2 midnights from the point of admission due to high intensity of service, high risk for further deterioration and high frequency of surveillance  required.*  Author: Bobette Mo, MD 01/08/2023 4:04 PM  For on call review [email or web address].   This document was prepared using Dragon voice recognition software and may contain some unintended transcription errors.

## 2023-01-08 NOTE — ED Notes (Signed)
RT obtained ABG on pt with the following results. Pt on [place] 5 Lpm at time of collection. MD aware of results.    Latest Reference Range & Units Most Recent  Sample type  ARTERIAL 01/08/23 06:09  pH, Arterial 7.35 - 7.45  7.420 01/08/23 06:09  pCO2 arterial 32 - 48 mmHg 37.8 01/08/23 06:09  pO2, Arterial 83 - 108 mmHg 62 (L) 01/08/23 06:09  TCO2 22 - 32 mmol/L 26 01/08/23 06:09  Acid-Base Excess 0.0 - 2.0 mmol/L 0.0 01/08/23 06:09  Bicarbonate 20.0 - 28.0 mmol/L 24.6 01/08/23 06:09  O2 Saturation % 92 01/08/23 06:09  Patient temperature  98.7 F 01/08/23 06:09  Collection site  RADIAL, ALLEN'S TEST ACCEPTABLE 01/08/23 06:09  (L): Data is abnormally low

## 2023-01-08 NOTE — Plan of Care (Signed)

## 2023-01-08 NOTE — ED Notes (Signed)
RT placed pt on [place]  3 Lpm for desaturations to the upper 80's. Pt respiratory status stable w/BLBS clear/dim. Pt also given duo neb treatment prior to being placed on oxygen. RT will continued to be monitored while at [facility].    01/08/23 0218  Oxygen Therapy/Pulse Ox  O2 Device Nasal Cannula  O2 Therapy Oxygen  O2 Flow Rate (L/min) 3 L/min  FiO2 (%) 32 %

## 2023-01-08 NOTE — ED Notes (Signed)
Pt taken off [place] to attempt to walk on Pulse Ox but Pts Oxygen sats dropped immediately from 92% on 5LPM to 80% room air when standing and maintained 80 taking 20 steps.

## 2023-01-08 NOTE — ED Triage Notes (Signed)
Pt has had cough, fever, and SOB x 5 days. Pt was diagnosed with URI and Bladder infection and started on antibiotic. Pt continues to have cough and fever. Pt reports home temp of 101.5 and took Tylenol. Pt is afebrile on arrival. Pt's room air O2 sats 89%-93% during triage process but sats. increase with deep breathing.

## 2023-01-08 NOTE — ED Provider Notes (Signed)
[place] EMERGENCY DEPARTMENT AT [facility] Provider Note   CSN: [id number] Arrival date & time: 01/08/23  0141     History  Chief Complaint  Patient presents with   Fever   Cough    Brad Edwards is a 37 y.o. male.  Patient with a history of kidney stones presenting with cough and fever for the past 5 days.  He states he has been ill for about 5 days with respiratory illness including cough, congestion, fever and sore throat.  Has had sick children at home.  He was seen in the ED on October 16 with concern for possible pyelonephritis and started on Bactrim which she has been taking for the past 3 days.  He states he is not having any pain with urination or blood in the urine.  He still having fever at home up to 102 which improved with Tylenol and Motrin.  Comes in tonight because he had persistent fever, coughing, feeling short of breath and feel like he is not improving.  He has some chest pain with coughing.  Cough is nonproductive.  Does have a sore throat, headaches, body aches and chills.  Denies abdominal pain, nausea, vomiting, flank pain, testicular pain.  No chest pain unless he is coughing.  No history of asthma or COPD. Found to be hypoxic in triage.   Fever Associated symptoms: congestion, cough, headaches, myalgias and rhinorrhea   Associated symptoms: no chest pain, no dysuria, no nausea and no vomiting   Cough Associated symptoms: fever, headaches, myalgias, rhinorrhea and shortness of breath   Associated symptoms: no chest pain        Home Medications Prior to Admission medications   Medication Sig Start Date End Date Taking? Authorizing Provider  benzonatate (TESSALON) 100 MG capsule Take 1 capsule (100 mg total) by mouth every 8 (eight) hours. 01/04/23   Melene Plan, DO  HYDROcodone-acetaminophen (NORCO/VICODIN) 5-325 MG tablet Take 1-2 tablets by mouth every 4 (four) hours as needed. 01/28/18   Rolan Bucco, MD  ondansetron (ZOFRAN-ODT) 4 MG  disintegrating tablet 4mg  ODT q4 hours prn nausea/vomit 01/04/23   Melene Plan, DO  sulfamethoxazole-trimethoprim (BACTRIM DS) 800-160 MG tablet Take 1 tablet by mouth 2 (two) times daily for 7 days. 01/04/23 01/11/23  Melene Plan, DO  tamsulosin (FLOMAX) 0.4 MG CAPS capsule Take 1 capsule (0.4 mg total) by mouth daily. 01/28/18   Rolan Bucco, MD      Allergies    Patient has no known allergies.    Review of Systems   Review of Systems  Constitutional:  Positive for activity change, appetite change, fatigue and fever.  HENT:  Positive for congestion and rhinorrhea.   Respiratory:  Positive for cough and shortness of breath.   Cardiovascular:  Negative for chest pain.  Gastrointestinal:  Negative for abdominal pain, nausea and vomiting.  Genitourinary:  Negative for dysuria and hematuria.  Musculoskeletal:  Positive for arthralgias and myalgias.  Neurological:  Positive for weakness and headaches.   all other systems are negative except as noted in the HPI and PMH.    Physical Exam Updated Vital Signs BP (!) 150/87   Pulse (!) 108   Temp 98.7 F (37.1 C) (Oral)   Resp 13   SpO2 93%  Physical Exam Vitals and nursing note reviewed.  Constitutional:      General: He is not in acute distress.    Appearance: He is well-developed. He is ill-appearing.     Comments: Ill-appearing but nontoxic.  Congested  HENT:     Head: Normocephalic and atraumatic.     Nose: Congestion present.     Mouth/Throat:     Pharynx: No oropharyngeal exudate.  Eyes:     Conjunctiva/sclera: Conjunctivae normal.     Pupils: Pupils are equal, round, and reactive to light.  Neck:     Comments: No meningismus. Cardiovascular:     Rate and Rhythm: Regular rhythm. Tachycardia present.     Heart sounds: Normal heart sounds. No murmur heard. Pulmonary:     Effort: Pulmonary effort is normal. No respiratory distress.     Breath sounds: Normal breath sounds. No wheezing.  Abdominal:     Palpations: Abdomen  is soft.     Tenderness: There is no abdominal tenderness. There is no guarding or rebound.  Musculoskeletal:        General: No tenderness. Normal range of motion.     Cervical back: Normal range of motion and neck supple.  Skin:    General: Skin is warm.  Neurological:     Mental Status: He is alert and oriented to person, place, and time.     Cranial Nerves: No cranial nerve deficit.     Motor: No abnormal muscle tone.     Coordination: Coordination normal.     Comments:  5/5 strength throughout. CN 2-12 intact.Equal grip strength.   Psychiatric:        Behavior: Behavior normal.    ED Results / Procedures / Treatments   Labs (all labs ordered are listed, but only abnormal results are displayed) Labs Reviewed  CBC WITH DIFFERENTIAL/PLATELET - Abnormal; Notable for the following components:      Result Value   Hemoglobin 12.9 (*)    Abs Immature Granulocytes 0.10 (*)    All other components within normal limits  BASIC METABOLIC PANEL - Abnormal; Notable for the following components:   Potassium 3.4 (*)    Glucose, Bld 121 (*)    All other components within normal limits  D-DIMER, QUANTITATIVE - Abnormal; Notable for the following components:   D-Dimer, Quant 2.19 (*)    All other components within normal limits  URINALYSIS, ROUTINE W REFLEX MICROSCOPIC - Abnormal; Notable for the following components:   Specific Gravity, Urine 1.042 (*)    Bilirubin Urine SMALL (*)    Protein, ur 100 (*)    All other components within normal limits  I-STAT ARTERIAL BLOOD GAS, ED - Abnormal; Notable for the following components:   pO2, Arterial 62 (*)    Sodium 134 (*)    Potassium 3.4 (*)    HCT 37.0 (*)    Hemoglobin 12.6 (*)    All other components within normal limits  RESP PANEL BY RT-PCR (RSV, FLU A&B, COVID)  RVPGX2  CULTURE, BLOOD (ROUTINE X 2)  CULTURE, BLOOD (ROUTINE X 2)  LACTIC ACID, PLASMA  TROPONIN I (HIGH SENSITIVITY)  TROPONIN I (HIGH SENSITIVITY)    EKG EKG  Interpretation Date/Time:  Sunday January 08 2023 02:12:35 EDT Ventricular Rate:  96 PR Interval:  133 QRS Duration:  93 QT Interval:  339 QTC Calculation: 429 R Axis:   30  Text Interpretation: Sinus rhythm Borderline T abnormalities, inferior leads Baseline wander in lead(s) I II aVR V2 V3 No previous ECGs available Confirmed by Glynn Octave [phone]) on 01/08/2023 2:19:28 AM  Radiology CT Angio Chest PE W and/or Wo Contrast  Result Date: 01/08/2023 CLINICAL DATA:  Pulmonary embolism suspected.  Positive for D-dimer. EXAM: CT ANGIOGRAPHY CHEST WITH  CONTRAST TECHNIQUE: Multidetector CT imaging of the chest was performed using the standard protocol during bolus administration of intravenous contrast. Multiplanar CT image reconstructions and MIPs were obtained to evaluate the vascular anatomy. RADIATION DOSE REDUCTION: This exam was performed according to the departmental dose-optimization program which includes automated exposure control, adjustment of the mA and/or kV according to patient size and/or use of iterative reconstruction technique. CONTRAST:  75mL OMNIPAQUE IOHEXOL 350 MG/ML SOLN COMPARISON:  None Available. FINDINGS: Cardiovascular: Suboptimal but satisfactory opacification of the pulmonary arteries to the segmental level. No evidence of pulmonary embolism. Normal heart size. No pericardial effusion. Mediastinum/Nodes: Thickening of hilar lymph nodes considered reactive given the pulmonary findings. Lungs/Pleura: Reticulonodular opacity affecting all lobes with areas of consolidation especially in the superior segment left lower lobe and posterior segment right upper lobe. The central airways are clear. There is generalized airway thickening. No edema, effusion, or pneumothorax Upper Abdomen: There may be hepatic steatosis but limited by postcontrast only assessment. No acute finding. Musculoskeletal: Unremarkable Review of the MIP images confirms the above findings. IMPRESSION: 1.  Multi lobar pneumonia. 2. Negative for pulmonary embolism. Electronically Signed   By: Tiburcio Pea M.D.   On: 01/08/2023 05:51   DG Chest Port 1 View  Result Date: 01/08/2023 CLINICAL DATA:  Shortness of breath EXAM: PORTABLE CHEST 1 VIEW COMPARISON:  None FINDINGS: Reticulonodular opacity bilaterally. Lung volumes are low. Normal heart size and mediastinal contours. No effusion or pneumothorax. IMPRESSION: Reticulonodular infiltrates bilaterally, suspicious for pneumonia. Low lung volumes. Electronically Signed   By: Tiburcio Pea M.D.   On: 01/08/2023 04:45    Procedures .Critical Care  Performed by: Glynn Octave, MD Authorized by: Glynn Octave, MD   Critical care provider statement:    Critical care time (minutes):  45   Critical care time was exclusive of:  Separately billable procedures and treating other patients   Critical care was necessary to treat or prevent imminent or life-threatening deterioration of the following conditions:  Respiratory failure   Critical care was time spent personally by me on the following activities:  Development of treatment plan with patient or surrogate, discussions with consultants, evaluation of patient's response to treatment, examination of patient, ordering and review of laboratory studies, ordering and review of radiographic studies, ordering and performing treatments and interventions, pulse oximetry, re-evaluation of patient's condition, review of old charts, blood draw for specimens and obtaining history from patient or surrogate   I assumed direction of critical care for this patient from another provider in my specialty: no     Care discussed with: admitting provider       Medications Ordered in ED Medications  ipratropium-albuterol (DUONEB) 0.5-2.5 (3) MG/3ML nebulizer solution 3 mL (3 mLs Nebulization Given 01/08/23 0211)    ED Course/ Medical Decision Making/ A&P                                 Medical Decision  Making Amount and/or Complexity of Data Reviewed Labs: ordered. Decision-making details documented in ED Course. Radiology: ordered and independent interpretation performed. Decision-making details documented in ED Course. ECG/medicine tests: ordered and independent interpretation performed. Decision-making details documented in ED Course.  Risk Prescription drug management. Decision regarding hospitalization.   5 days of fever, chills, respiratory symptoms and UTI symptoms.  Hypoxic and febrile and tachycardic on arrival.  Requiring nasal cannula oxygen to maintain saturations greater than 90%.  Breath sounds are diminished bilaterally.  Will obtain labs, chest x-ray, EKG  X-ray concerning for interstitial infiltrates bilaterally.  Labs show normal lactate.  No leukocytosis.  Culture sent.  Patient given IV fluids and IV antibiotics after cultures were obtained.  COVID and flu testing are negative.  D-dimer positive.  No evidence of pulmonary embolism on CT scan.  Does confirm bilateral pneumonia.  With new O2 requirement, patient will need admission.  He drops to 80% on room air prior to walking at all.  Does require 5 L of oxygen to maintain O2 sats above 90.  Admission discussed with Dr. Antionette Char.        Final Clinical Impression(s) / ED Diagnoses Final diagnoses:  Acute respiratory failure with hypoxia [facility])  Multifocal pneumonia    Rx / DC Orders ED Discharge Orders     None         Tamari Busic, Jeannett Senior, MD 01/08/23 [phone]

## 2023-01-08 NOTE — ED Notes (Signed)
Called [facility] for transport, pt bed assignment is ready

## 2023-01-08 NOTE — ED Notes (Signed)
Report called to [place] at [facility] for room [phone].

## 2023-01-09 DIAGNOSIS — J189 Pneumonia, unspecified organism: Secondary | ICD-10-CM | POA: Diagnosis not present

## 2023-01-09 LAB — RESPIRATORY PANEL BY PCR

## 2023-01-09 LAB — CBC
HCT: 37.2 % — ABNORMAL LOW (ref 39.0–52.0)
Hemoglobin: 11.8 g/dL — ABNORMAL LOW (ref 13.0–17.0)
MCH: 28.7 pg (ref 26.0–34.0)
MCHC: 31.7 g/dL (ref 30.0–36.0)
MCV: 90.5 fL (ref 80.0–100.0)
Platelets: 247 10*3/uL (ref 150–400)
RBC: 4.11 MIL/uL — ABNORMAL LOW (ref 4.22–5.81)
RDW: 15.3 % (ref 11.5–15.5)
WBC: 10.8 10*3/uL — ABNORMAL HIGH (ref 4.0–10.5)
nRBC: 0.2 % (ref 0.0–0.2)

## 2023-01-09 LAB — COMPREHENSIVE METABOLIC PANEL
ALT: 33 U/L (ref 0–44)
AST: 23 U/L (ref 15–41)
Albumin: 3.5 g/dL (ref 3.5–5.0)
Alkaline Phosphatase: 73 U/L (ref 38–126)
Anion gap: 10 (ref 5–15)
BUN: 12 mg/dL (ref 6–20)
CO2: 24 mmol/L (ref 22–32)
Calcium: 8.9 mg/dL (ref 8.9–10.3)
Chloride: 100 mmol/L (ref 98–111)
Creatinine, Ser: 0.86 mg/dL (ref 0.61–1.24)
GFR, Estimated: >60 mL/min (ref >60)
Glucose, Bld: 199 mg/dL — ABNORMAL HIGH (ref 70–99)
Potassium: 4.6 mmol/L (ref 3.5–5.1)
Sodium: 134 mmol/L — ABNORMAL LOW (ref 135–145)
Total Bilirubin: 0.8 mg/dL (ref 0.3–1.2)
Total Protein: 7.7 g/dL (ref 6.5–8.1)

## 2023-01-09 LAB — STREP PNEUMONIAE URINARY ANTIGEN: Strep Pneumo Urinary Antigen: NEGATIVE

## 2023-01-09 LAB — EXPECTORATED SPUTUM ASSESSMENT W GRAM STAIN, RFLX TO RESP C

## 2023-01-09 LAB — BRAIN NATRIURETIC PEPTIDE: B Natriuretic Peptide: 52.5 pg/mL (ref 0.0–100.0)

## 2023-01-09 LAB — PROCALCITONIN: Procalcitonin: 0.26 ng/mL

## 2023-01-09 LAB — HIV ANTIBODY (ROUTINE TESTING W REFLEX): HIV Screen 4th Generation wRfx: NONREACTIVE

## 2023-01-09 MED ORDER — AZITHROMYCIN 250 MG PO TABS: 500.0000 mg | ORAL_TABLET | Freq: Every day | ORAL | Status: DC

## 2023-01-09 MED ORDER — DM-GUAIFENESIN ER 30-600 MG PO TB12
1.0000 | ORAL_TABLET | Freq: Two times a day (BID) | ORAL | Status: DC
  Administered 2023-01-09 – 2023-01-10 (×3): 1 via ORAL
  Filled 2023-01-09 (×3): qty 1

## 2023-01-09 MED ORDER — ALBUTEROL SULFATE (2.5 MG/3ML) 0.083% IN NEBU: 2.5000 mg | INHALATION_SOLUTION | RESPIRATORY_TRACT | Status: DC | PRN

## 2023-01-09 MED ORDER — GUAIFENESIN-DM 100-10 MG/5ML PO SYRP: 5.0000 mL | ORAL_SOLUTION | ORAL | Status: DC | PRN

## 2023-01-09 MED ORDER — BENZONATATE 100 MG PO CAPS
100.0000 mg | ORAL_CAPSULE | Freq: Three times a day (TID) | ORAL | Status: DC
  Administered 2023-01-09 – 2023-01-11 (×5): 100 mg via ORAL
  Filled 2023-01-09 (×5): qty 1

## 2023-01-09 MED ORDER — PREDNISONE 50 MG PO TABS
50.0000 mg | ORAL_TABLET | Freq: Every day | ORAL | Status: DC
Start: 2023-01-09 — End: 2023-01-11
  Administered 2023-01-09 – 2023-01-11 (×3): 50 mg via ORAL
  Filled 2023-01-09 (×3): qty 1

## 2023-01-09 NOTE — Progress Notes (Signed)
[facility] Progress Note Patient: Brad Edwards VQQ:[id number] DOB: Aug 09, 1985 DOA: 01/08/2023  DOS: the patient was seen and examined on 01/09/2023  Brief hospital course: PMH of obesity presents to the hospital with complaints of shortness of breath and cough.  Found to have multifocal pneumonia.  Was recently prescribed Bactrim for UTI. Assessment and Plan: Multifocal pneumonia Acute hypoxia Etiology not clear for Check RVP. Procalcitonin minimally elevated. Strep pneumonia antigen negative. Sputum culture pending. Will continue to monitor blood cultures as well. Continue IV antibiotic. Add steroids. Continue DuoNebs. Supplemental oxygen.  Currently 83% on room air.   UTI (urinary tract infection) Hold Bactrim. Continue ceftriaxone as above. No urine culture results available.   Hypokalemia Replacing. Magnesium was supplemented.   Normocytic anemia Follow H&H   Obesity Class 1 Body mass index is 38.94 kg/m.  Placing the pt at higher risk of poor outcomes. Does not have class III obesity.  Subjective: No nausea vomiting.  Ongoing shortness of breath ongoing cough.  No fatigue.  Had 1 loose BM.  No blood in the stool.  Physical Exam: General: in moderate distress, No Rash Cardiovascular: S1 and S2 Present, No Murmur Respiratory: Increased respiratory effort, Bilateral Air entry present.  Bilateral crackles, bilateral wheezes Abdomen: Bowel Sound present, No tenderness Extremities: No edema Neuro: Alert and oriented x3, no new focal deficit  Data Reviewed: I have Reviewed nursing notes, Vitals, and Lab results. Since last encounter, pertinent lab results CBC and BMP   . I have ordered test including CBC and BMP  .   Disposition: Status is: Inpatient Remains inpatient appropriate because: Needing improvement in oxygenation   Family Communication: Family at bedside Level of care: Progressive   Vitals:   01/09/23 1711 01/09/23 1712 01/09/23 1713 01/09/23  1714  BP:      Pulse: 93 92 95 86  Resp: 20 (!) 24 19 18   Temp:      TempSrc:      SpO2: 94% 93% 93% 94%  Weight:      Height:         Author: Lynden Oxford, MD 01/09/2023 6:31 PM  Please look on [email or web address] to find out who is on call.

## 2023-01-09 NOTE — Progress Notes (Signed)
Mobility Specialist - Progress Note  Pre-mobility: 100% SpO2 During mobility: 108 bpm HR, 91% SpO2 Post-mobility: 92 bpm HR, 95% SPO2   01/09/23 1404  Oxygen Therapy  O2 Device HFNC  O2 Flow Rate (L/min) 6 L/min  Patient Activity (if Appropriate) Ambulating  Mobility  Activity Ambulated independently in hallway  Level of Assistance Independent  Assistive Device None  Distance Ambulated (ft) 350 ft  Range of Motion/Exercises Active  Activity Response Tolerated well  $Mobility charge 1 Mobility  Mobility Specialist Start Time (ACUTE ONLY) 1355  Mobility Specialist Stop Time (ACUTE ONLY) 1404  Mobility Specialist Time Calculation (min) (ACUTE ONLY) 9 min   Pt was found sitting EOB and agreeable to ambulate. No complaints with session. At EOS returned to sit EOB with all needs met. Call bell in reach.   Billey Chang Mobility Specialist

## 2023-01-10 DIAGNOSIS — J189 Pneumonia, unspecified organism: Secondary | ICD-10-CM | POA: Diagnosis not present

## 2023-01-10 MED ORDER — DOXYCYCLINE HYCLATE 100 MG PO TABS
100.0000 mg | ORAL_TABLET | Freq: Two times a day (BID) | ORAL | Status: DC
  Administered 2023-01-10 – 2023-01-11 (×3): 100 mg via ORAL
  Filled 2023-01-10 (×3): qty 1

## 2023-01-10 MED ORDER — IPRATROPIUM-ALBUTEROL 0.5-2.5 (3) MG/3ML IN SOLN
3.0000 mL | Freq: Three times a day (TID) | RESPIRATORY_TRACT | Status: DC
  Administered 2023-01-10 – 2023-01-11 (×4): 3 mL via RESPIRATORY_TRACT
  Filled 2023-01-10 (×4): qty 3

## 2023-01-10 NOTE — Progress Notes (Signed)
[facility] Progress Note Patient: Brad Edwards [id number]:[id number] DOB: 1985/07/09 DOA: 01/08/2023  DOS: the patient was seen and examined on 01/10/2023  Brief hospital course: PMH of obesity presents to the hospital with complaints of shortness of breath and cough.  Found to have multifocal pneumonia.  Was recently prescribed Bactrim for UTI. Assessment and Plan: Mycoplasma multifocal pneumonia Acute hypoxia Etiology not clear for RVP positive for mycoplasma. Procalcitonin minimally elevated. Strep pneumonia antigen negative. Sputum culture pending. Will continue to monitor blood cultures as well. Continue antibiotic.  Switch to oral doxycycline.  Total 10-day treatment course recommended. Continue steroids. Continue DuoNebs. Supplemental oxygen.  Currently 88% on 2 LPM.   UTI (urinary tract infection) Reported history of prior to admission. Patient has taken some Bactrim at home as well as 2 doses of IV ceftriaxone in the hospital. At present no urinary symptoms to continue antibiotics for them.   Hypokalemia Replacing. Magnesium was supplemented.   Normocytic anemia Follow H&H   Obesity Class 1 Body mass index is 38.94 kg/m.  Placing the pt at higher risk of poor outcomes. Does not have class III obesity.  Need for sleep apnea evaluation. Likely have sleep apnea per family.  Will require outpatient sleep study referral.  Subjective: No acute complaint.  Continues to have cooperative continues to have shortness of breath.  Ambulating with oxygen.  No chest pain.  Physical Exam: In mild distress. Bilateral crackles heard. Occasional wheezing. S1-S2 present.   No edema.  Data Reviewed: I have Reviewed nursing notes, Vitals, and Lab results. Since last encounter, pertinent lab results CBC and BMP   . I have ordered test including CBC and BMP  .   Disposition: Status is: Inpatient Remains inpatient appropriate because: Needing improvement in oxygenation    Family Communication: Family at bedside Level of care: Progressive   Vitals:   01/09/23 1714 01/09/23 2056 01/10/23 0509 01/10/23 1415  BP:  129/82 112/85 128/81  Pulse: 86 87 82   Resp: 18 20 13    Temp:  98.8 F (37.1 C) 97.9 F (36.6 C) 98.1 F (36.7 C)  TempSrc:   Oral Oral  SpO2: 94% 93% 90%   Weight:      Height:         Author: Lynden Oxford, MD 01/10/2023 7:17 PM  Please look on [email or web address] to find out who is on call.

## 2023-01-10 NOTE — Plan of Care (Signed)

## 2023-01-10 NOTE — TOC CM/SW Note (Signed)
Transition of Care [facility]) - Inpatient Brief Assessment   Patient Details  Name: Brad Edwards MRN: [id number] Date of Birth: 1985-05-12  [facility] CM/SW Contact:    Larrie Kass, LCSW Phone Number: 01/10/2023, 1:14 PM   Clinical Narrative:   Transition of Care Department [facility]) has reviewed patient and no TOC needs have been identified at this time. We will continue to monitor patient advancement through interdisciplinary progression rounds. If new patient transition needs arise, please place a TOC consult.  Transition of Care Asessment: Insurance and Status: Insurance coverage has been reviewed Patient has primary care physician: No (attched internal medicine to follow up) Home environment has been reviewed: home with self Prior level of function:: independent Prior/Current Home Services: No current home services Social Determinants of Health Reivew: SDOH reviewed no interventions necessary Readmission risk has been reviewed: Yes Transition of care needs: no transition of care needs at this time

## 2023-01-11 DIAGNOSIS — N3000 Acute cystitis without hematuria: Secondary | ICD-10-CM | POA: Diagnosis not present

## 2023-01-11 DIAGNOSIS — E66813 Obesity, class 3: Secondary | ICD-10-CM | POA: Diagnosis not present

## 2023-01-11 DIAGNOSIS — E876 Hypokalemia: Secondary | ICD-10-CM | POA: Diagnosis not present

## 2023-01-11 DIAGNOSIS — R0902 Hypoxemia: Secondary | ICD-10-CM

## 2023-01-11 DIAGNOSIS — D649 Anemia, unspecified: Secondary | ICD-10-CM

## 2023-01-11 DIAGNOSIS — J189 Pneumonia, unspecified organism: Secondary | ICD-10-CM | POA: Diagnosis not present

## 2023-01-11 LAB — CBC
HCT: 36.4 % — ABNORMAL LOW (ref 39.0–52.0)
Hemoglobin: 11.4 g/dL — ABNORMAL LOW (ref 13.0–17.0)
MCH: 28.6 pg (ref 26.0–34.0)
MCHC: 31.3 g/dL (ref 30.0–36.0)
MCV: 91.5 fL (ref 80.0–100.0)
Platelets: 270 10*3/uL (ref 150–400)
RBC: 3.98 MIL/uL — ABNORMAL LOW (ref 4.22–5.81)
RDW: 15.5 % (ref 11.5–15.5)
WBC: 10.3 10*3/uL (ref 4.0–10.5)
nRBC: 0 % (ref 0.0–0.2)

## 2023-01-11 LAB — CULTURE, RESPIRATORY W GRAM STAIN: Culture: NORMAL

## 2023-01-11 LAB — MAGNESIUM: Magnesium: 2.5 mg/dL — ABNORMAL HIGH (ref 1.7–2.4)

## 2023-01-11 LAB — BASIC METABOLIC PANEL
Anion gap: 10 (ref 5–15)
BUN: 14 mg/dL (ref 6–20)
CO2: 27 mmol/L (ref 22–32)
Calcium: 8.8 mg/dL — ABNORMAL LOW (ref 8.9–10.3)
Chloride: 99 mmol/L (ref 98–111)
Creatinine, Ser: 0.75 mg/dL (ref 0.61–1.24)
GFR, Estimated: >60 mL/min (ref >60)
Glucose, Bld: 107 mg/dL — ABNORMAL HIGH (ref 70–99)
Potassium: 3.7 mmol/L (ref 3.5–5.1)
Sodium: 136 mmol/L (ref 135–145)

## 2023-01-11 MED ORDER — FLUTICASONE PROPIONATE 50 MCG/ACT NA SUSP
2.0000 | Freq: Every day | NASAL | Status: DC
  Administered 2023-01-11: 2 via NASAL
  Filled 2023-01-11: qty 16

## 2023-01-11 MED ORDER — PANTOPRAZOLE SODIUM 40 MG PO TBEC
40.0000 mg | DELAYED_RELEASE_TABLET | Freq: Every day | ORAL | Status: DC
  Administered 2023-01-11: 40 mg via ORAL
  Filled 2023-01-11: qty 1

## 2023-01-11 MED ORDER — GUAIFENESIN ER 600 MG PO TB12: 1200.0000 mg | ORAL_TABLET | Freq: Two times a day (BID) | ORAL | 0 refills | Status: AC

## 2023-01-11 MED ORDER — DOXYCYCLINE HYCLATE 100 MG PO TABS: 100.0000 mg | ORAL_TABLET | Freq: Two times a day (BID) | ORAL | 0 refills | Status: AC

## 2023-01-11 MED ORDER — DM-GUAIFENESIN ER 30-600 MG PO TB12
2.0000 | ORAL_TABLET | Freq: Two times a day (BID) | ORAL | Status: DC
  Administered 2023-01-11: 2 via ORAL
  Filled 2023-01-11 (×2): qty 1

## 2023-01-11 MED ORDER — ALBUTEROL SULFATE HFA 108 (90 BASE) MCG/ACT IN AERS
INHALATION_SPRAY | RESPIRATORY_TRACT | 0 refills | Status: AC
Start: 2023-01-11 — End: 2023-05-16

## 2023-01-11 MED ORDER — LORATADINE 10 MG PO TABS
10.0000 mg | ORAL_TABLET | Freq: Every day | ORAL | Status: DC
  Administered 2023-01-11: 10 mg via ORAL
  Filled 2023-01-11: qty 1

## 2023-01-11 MED ORDER — PREDNISONE 20 MG PO TABS: ORAL_TABLET | ORAL | 0 refills | Status: AC

## 2023-01-11 MED ORDER — BENZONATATE 100 MG PO CAPS: 100.0000 mg | ORAL_CAPSULE | Freq: Three times a day (TID) | ORAL | 0 refills | Status: AC

## 2023-01-11 MED ORDER — PANTOPRAZOLE SODIUM 40 MG PO TBEC: 40.0000 mg | DELAYED_RELEASE_TABLET | Freq: Every day | ORAL | 0 refills | Status: AC

## 2023-01-11 MED ORDER — LORATADINE 10 MG PO TABS: 10.0000 mg | ORAL_TABLET | Freq: Every day | ORAL | 0 refills | Status: AC

## 2023-01-11 MED ORDER — FLUTICASONE PROPIONATE 50 MCG/ACT NA SUSP: 2.0000 | Freq: Every day | NASAL | 0 refills | Status: AC

## 2023-01-11 NOTE — Discharge Summary (Signed)
Physician Discharge Summary  ALEXADER SUTHERLAND MWN:[id number] DOB: 17-Mar-1986 DOA: 01/08/2023  PCP: Patient, No Pcp Per  Admit date: 01/08/2023 Discharge date: 01/11/2023  Time spent: 60 minutes  Recommendations for Outpatient Follow-up:  Follow-up with Jessica Priest, FNP to establish PCP care and for hospital follow-up.  Patient will need a basic metabolic profile done to follow-up on electrolytes and renal function.  Patient will need referral to pulmonary for evaluation for OSA.   Discharge Diagnoses:  Principal Problem:   Multifocal pneumonia Active Problems:   UTI (urinary tract infection)   Hypokalemia   Normocytic anemia   Class 3 obesity   Hypoxia   Discharge Condition: Stable and improved.  Diet recommendation: Regular  Filed Weights   01/08/23 1700  Weight: 130.2 kg    History of present illness:  HPI per Dr. Patsy Lager is a 37 y.o. male with medical history significant of nephrolithiasis, lithotripsy,Class III obesity, aortic area who was seeing in the emergency department on Wednesday afternoon due to URI symptoms and UTI, but returned earlier this morning due to fever, fatigue, malaise, wheezing, productive cough and progressively worsening dyspnea. He denied hemoptysis.  No chest pain, palpitations, diaphoresis, PND, orthopnea or pitting edema of the lower extremities.  No abdominal pain, nausea, emesis, constipation, melena or hematochezia: But had diarrhea after starting prescribed Bactrim for UTI.  Significant frequency with mild dysuria, but no flank pain or hematuria.  No polyuria, polydipsia, polyphagia or blurred vision.    Lab work: Urinalysis with a specific gravity 1.042, small bilirubin and proteinuria 100 mg/deciliter.  Coronavirus, influenza and RSV PCR negative.  CBC showed a white count of 9.2 with 76% neutrophils, hemoglobin 12.9 g/dL platelets 403.  D-dimer 2.19.  Troponin x 2 normal.  Lactic acid 1.4 mmol/L.  BMP showed a potassium of 3.4  mmol/L and a glucose of 121 mg/dL.   Imaging: Portable 1 view chest radiograph with reticulonodular infiltrates bilaterally.  CTA chest showing multilobar pneumonia, but no pulmonary embolism.   ED course: Initial vital signs were temperature 98.7 F, pulse 98, respiration 20, BP 150/87 mmHg O2 sat 92% on room air but subsequently his oxygen requirement has increased to 6 L/min.  He received ceftriaxone and azithromycin.  Hospital Course:  #1 multifocal mycoplasma pneumonia pneumonia/acute hypoxia -Patient had presented with fever, fatigue, malaise, wheezing, productive cough and progressive worsening dyspnea. -Chest x-ray done read reticulonodular infiltrates bilaterally. -CT angiogram chest done with multilobar pneumonia, negative for pulmonary embolism. -Respiratory viral panel was done which was positive for mycoplasma. -Procalcitonin noted to be minimally elevated. -Urine strep pneumococcus antigen negative. -Sputum culture was ordered and pending at time of discharge. -Patient initially placed on IV Rocephin and azithromycin and subsequently transition to doxycycline after respiratory viral panel had resulted. -Patient also maintained on scheduled DuoNebs as well as oral prednisone. -Patient noted to have some nocturnal desats with concerns for OSA. -Patient improved clinically, such that by day of discharge patient noted to have ambulatory sats of 94 to 100% on room air. -Patient somewhat anxious to be discharged and patient will be discharged in stable and improved condition on a 7-day course of doxycycline, Mucinex, steroid taper, Flonase, Claritin, albuterol MDI. -Outpatient follow-up with PCP as scheduled.  2.  UTI -Patient reported a history of UTI prior to admission was on Bactrim prior to presentation received 2 doses of IV Rocephin in house. -Patient with no urinary symptoms while hospitalized and patient felt to be adequately treated.  3.  Hypokalemia -Repleted. -Outpatient follow-up.  4.  Normocytic anemia -Remained stable at 11.4 by day of discharge. -Outpatient follow-up with PCP.  5.  Obesity class I -BMI 38.94 kg/m. -Lifestyle modification. -Outpatient follow-up with PCP. -Patient does not have class III obesity.  6.  Need for sleep apnea evaluation -Patient noted to have nocturnal desats and family concerned of OSA. -Family stating they have set up appointment with PCP and will get a referral for evaluation for OSA. -Nocturnal O2 ordered for patient while awaiting evaluation for OSA and outpatient follow-up with PCP.   Procedures: CT angiogram chest 01/08/2023 Chest x-ray 01/08/2023  Consultations: None  Discharge Exam: Vitals:   01/11/23 1230 01/11/23 1348  BP: 130/81   Pulse:    Resp: 16   Temp: 98.1 F (36.7 C)   SpO2:  90%    General: NAD. Cardiovascular: RRR no murmurs rubs or gallops.  No JVD.  No pitting lower extremity edema. Respiratory: Some coarse breath sounds in the bases.  No wheezing.  Fair air movement.  Speaking in full sentences.  Discharge Instructions   Discharge Instructions     Diet general   Complete by: As directed    Increase activity slowly   Complete by: As directed       Allergies as of 01/11/2023   No Known Allergies      Medication List     STOP taking these medications    sulfamethoxazole-trimethoprim 800-160 MG tablet Commonly known as: BACTRIM DS       TAKE these medications    acetaminophen 500 MG tablet Commonly known as: TYLENOL Take 500 mg by mouth every 6 (six) hours as needed for fever or moderate pain (pain score 4-6).   albuterol 108 (90 Base) MCG/ACT inhaler Commonly known as: VENTOLIN HFA Inhale 2 puffs into the lungs 2 (two) times daily for 5 days, THEN 2 puffs every 6 (six) hours as needed for wheezing or shortness of breath. Start taking on: January 11, 2023   benzonatate 100 MG capsule Commonly known as:  TESSALON Take 1 capsule (100 mg total) by mouth 3 (three) times daily for 7 days. What changed: when to take this   doxycycline 100 MG tablet Commonly known as: VIBRA-TABS Take 1 tablet (100 mg total) by mouth every 12 (twelve) hours for 7 days.   fluticasone 50 MCG/ACT nasal spray Commonly known as: FLONASE Place 2 sprays into both nostrils daily for 7 days. Start taking on: January 12, 2023   guaiFENesin 600 MG 12 hr tablet Commonly known as: Mucinex Take 2 tablets (1,200 mg total) by mouth 2 (two) times daily for 7 days.   ibuprofen 200 MG tablet Commonly known as: ADVIL Take 200 mg by mouth every 6 (six) hours as needed for fever or moderate pain (pain score 4-6).   loratadine 10 MG tablet Commonly known as: CLARITIN Take 1 tablet (10 mg total) by mouth daily. Start taking on: January 12, 2023   ondansetron 4 MG disintegrating tablet Commonly known as: ZOFRAN-ODT 4mg  ODT q4 hours prn nausea/vomit   pantoprazole 40 MG tablet Commonly known as: PROTONIX Take 1 tablet (40 mg total) by mouth daily at 6 (six) AM. Start taking on: January 12, 2023   predniSONE 20 MG tablet Commonly known as: DELTASONE Take 2 tablets (40 mg total) by mouth daily with breakfast for 3 days, THEN 1 tablet (20 mg total) daily with breakfast for 3 days. Start taking on: January 11, 2023  Durable Medical Equipment  (From admission, onward)           Start     Ordered   01/11/23 1300  For home use only DME oxygen  Once       Question Answer Comment  Length of Need 6 Months   Mode or (Route) Nasal cannula   Liters per Minute 4   Frequency Only at night (stationary unit needed)   Oxygen conserving device Yes   Oxygen delivery system Gas      01/11/23 1259           No Known Allergies  Follow-up Information     Soundra Pilon, FNP Follow up on 01/12/2023.   Specialty: Family Medicine Contact information: [phone] [address] [place]  [address] [phone]                  The results of significant diagnostics from this hospitalization (including imaging, microbiology, ancillary and laboratory) are listed below for reference.    Significant Diagnostic Studies: CT Angio Chest PE W and/or Wo Contrast  Result Date: 01/08/2023 CLINICAL DATA:  Pulmonary embolism suspected.  Positive for D-dimer. EXAM: CT ANGIOGRAPHY CHEST WITH CONTRAST TECHNIQUE: Multidetector CT imaging of the chest was performed using the standard protocol during bolus administration of intravenous contrast. Multiplanar CT image reconstructions and MIPs were obtained to evaluate the vascular anatomy. RADIATION DOSE REDUCTION: This exam was performed according to the departmental dose-optimization program which includes automated exposure control, adjustment of the mA and/or kV according to patient size and/or use of iterative reconstruction technique. CONTRAST:  75mL OMNIPAQUE IOHEXOL 350 MG/ML SOLN COMPARISON:  None Available. FINDINGS: Cardiovascular: Suboptimal but satisfactory opacification of the pulmonary arteries to the segmental level. No evidence of pulmonary embolism. Normal heart size. No pericardial effusion. Mediastinum/Nodes: Thickening of hilar lymph nodes considered reactive given the pulmonary findings. Lungs/Pleura: Reticulonodular opacity affecting all lobes with areas of consolidation especially in the superior segment left lower lobe and posterior segment right upper lobe. The central airways are clear. There is generalized airway thickening. No edema, effusion, or pneumothorax Upper Abdomen: There may be hepatic steatosis but limited by postcontrast only assessment. No acute finding. Musculoskeletal: Unremarkable Review of the MIP images confirms the above findings. IMPRESSION: 1. Multi lobar pneumonia. 2. Negative for pulmonary embolism. Electronically Signed   By: Tiburcio Pea M.D.   On: 01/08/2023 05:51   DG Chest Port 1 View  Result  Date: 01/08/2023 CLINICAL DATA:  Shortness of breath EXAM: PORTABLE CHEST 1 VIEW COMPARISON:  None FINDINGS: Reticulonodular opacity bilaterally. Lung volumes are low. Normal heart size and mediastinal contours. No effusion or pneumothorax. IMPRESSION: Reticulonodular infiltrates bilaterally, suspicious for pneumonia. Low lung volumes. Electronically Signed   By: Tiburcio Pea M.D.   On: 01/08/2023 04:45    Microbiology: Recent Results (from the past 240 hour(s))  SARS Coronavirus 2 by RT PCR (hospital order, performed in [facility] hospital lab) *cepheid single result test* Anterior Nasal Swab     Status: None   Collection Time: 01/04/23  2:40 PM   Specimen: Anterior Nasal Swab  Result Value Ref Range Status   SARS Coronavirus 2 by RT PCR NEGATIVE NEGATIVE Final    Comment: Performed at [facility], [address], [place], [place] [address]  Blood culture (routine x 2)     Status: None (Preliminary result)   Collection Time: 01/08/23  2:10 AM   Specimen: BLOOD  Result Value Ref Range Status  Specimen Description   Final    BLOOD RIGHT ANTECUBITAL Performed at [facility], [address], [place], [place] [address]    Special Requests   Final    BOTTLES DRAWN AEROBIC AND ANAEROBIC Blood Culture adequate volume Performed at [facility], [address] [place], [place] [address]    Culture   Final    NO GROWTH 3 DAYS Performed at [facility], [address], [place], [place] [address]    Report Status PENDING  Incomplete  Blood culture (routine x 2)     Status: None (Preliminary result)   Collection Time: 01/08/23  2:30 AM   Specimen: BLOOD  Result Value Ref Range Status   Specimen Description   Final    BLOOD BLOOD RIGHT HAND Performed at [facility], [address] [place], [place] [address]    Special Requests   Final    BOTTLES DRAWN AEROBIC AND ANAEROBIC Blood Culture adequate volume Performed  at [facility], [address] [place], [place] [address]    Culture   Final    NO GROWTH 3 DAYS Performed at [facility], [address] [place], [place] [address]    Report Status PENDING  Incomplete  Resp panel by RT-PCR (RSV, Flu A&B, Covid) Anterior Nasal Swab     Status: None   Collection Time: 01/08/23  2:42 AM   Specimen: Anterior Nasal Swab  Result Value Ref Range Status   SARS Coronavirus 2 by RT PCR NEGATIVE NEGATIVE Final    Comment: (NOTE) SARS-CoV-2 target nucleic acids are NOT DETECTED.  The SARS-CoV-2 RNA is generally detectable in upper respiratory specimens during the acute phase of infection. The lowest concentration of SARS-CoV-2 viral copies this assay can detect is 138 copies/mL. A negative result does not preclude SARS-Cov-2 infection and should not be used as the sole basis for treatment or other patient management decisions. A negative result may occur with  improper specimen collection/handling, submission of specimen other than nasopharyngeal swab, presence of viral mutation(s) within the areas targeted by this assay, and inadequate number of viral copies(<138 copies/mL). A negative result must be combined with clinical observations, patient history, and epidemiological information. The expected result is Negative.  Fact Sheet for Patients:  BloggerCourse.com  Fact Sheet for Healthcare Providers:  SeriousBroker.it  This test is no t yet approved or cleared by the [place] FDA and  has been authorized for detection and/or diagnosis of SARS-CoV-2 by FDA under an Emergency Use Authorization (EUA). This EUA will remain  in effect (meaning this test can be used) for the duration of the COVID-19 declaration under Section 564(b)(1) of the Act, 21 U.S.C.section 360bbb-3(b)(1), unless the authorization is terminated  or revoked sooner.       Influenza A by PCR NEGATIVE  NEGATIVE Final   Influenza B by PCR NEGATIVE NEGATIVE Final    Comment: (NOTE) The Xpert Xpress SARS-CoV-2/FLU/RSV plus assay is intended as an aid in the diagnosis of influenza from Nasopharyngeal swab specimens and should not be used as a sole basis for treatment. Nasal washings and aspirates are unacceptable for Xpert Xpress SARS-CoV-2/FLU/RSV testing.  Fact Sheet for Patients: BloggerCourse.com  Fact Sheet for Healthcare Providers: SeriousBroker.it  This test is not yet approved or cleared by the [place] FDA and has been authorized for detection and/or diagnosis of SARS-CoV-2 by FDA under an Emergency Use Authorization (EUA). This EUA will remain in effect (meaning this test can be used)  for the duration of the COVID-19 declaration under Section 564(b)(1) of the Act, 21 U.S.C. section 360bbb-3(b)(1), unless the authorization is terminated or revoked.     Resp Syncytial Virus by PCR NEGATIVE NEGATIVE Final    Comment: (NOTE) Fact Sheet for Patients: BloggerCourse.com  Fact Sheet for Healthcare Providers: SeriousBroker.it  This test is not yet approved or cleared by the [place] FDA and has been authorized for detection and/or diagnosis of SARS-CoV-2 by FDA under an Emergency Use Authorization (EUA). This EUA will remain in effect (meaning this test can be used) for the duration of the COVID-19 declaration under Section 564(b)(1) of the Act, 21 U.S.C. section 360bbb-3(b)(1), unless the authorization is terminated or revoked.  Performed at [facility], [address], [place], [place] [address]   Expectorated Sputum Assessment w Gram Stain, Rflx to Resp Cult     Status: None   Collection Time: 01/08/23  7:18 PM   Specimen: Expectorated Sputum  Result Value Ref Range Status   Specimen Description EXPECTORATED SPUTUM  Final   Special Requests  EXPECTORATED SPUTUM  Final   Sputum evaluation   Final    THIS SPECIMEN IS ACCEPTABLE FOR SPUTUM CULTURE Performed at [facility], [address] [place], [place] [address]    Report Status 01/09/2023 FINAL  Final  Culture, Respiratory w Gram Stain     Status: None   Collection Time: 01/08/23  7:18 PM  Result Value Ref Range Status   Specimen Description   Final    EXPECTORATED SPUTUM Performed at [facility], [address], [place], [place] [address]    Special Requests   Final    EXPECTORATED SPUTUM Reflexed from [id number] Performed at [facility], [address] [place], [place] [address]    Gram Stain   Final    FEW WBC PRESENT, PREDOMINANTLY PMN NO ORGANISMS SEEN    Culture   Final    RARE Normal respiratory flora-no Staph aureus or Pseudomonas seen Performed at [facility], [address], [place], [place] [address]    Report Status 01/11/2023 FINAL  Final  Respiratory (~20 pathogens) panel by PCR     Status: Abnormal   Collection Time: 01/09/23  7:54 AM   Specimen: Nasopharyngeal Swab; Respiratory  Result Value Ref Range Status   Adenovirus NOT DETECTED NOT DETECTED Final   Coronavirus 229E NOT DETECTED NOT DETECTED Final    Comment: (NOTE) The Coronavirus on the Respiratory Panel, DOES NOT test for the novel  Coronavirus (2019 nCoV)    Coronavirus HKU1 NOT DETECTED NOT DETECTED Final   Coronavirus NL63 NOT DETECTED NOT DETECTED Final   Coronavirus OC43 NOT DETECTED NOT DETECTED Final   Metapneumovirus NOT DETECTED NOT DETECTED Final   Rhinovirus / Enterovirus NOT DETECTED NOT DETECTED Final   Influenza A NOT DETECTED NOT DETECTED Final   Influenza B NOT DETECTED NOT DETECTED Final   Parainfluenza Virus 1 NOT DETECTED NOT DETECTED Final   Parainfluenza Virus 2 NOT DETECTED NOT DETECTED Final   Parainfluenza Virus 3 NOT DETECTED NOT DETECTED Final   Parainfluenza Virus 4 NOT DETECTED NOT DETECTED  Final   Respiratory Syncytial Virus NOT DETECTED NOT DETECTED Final   Bordetella pertussis NOT DETECTED NOT DETECTED Final   Bordetella Parapertussis NOT DETECTED NOT DETECTED Final   Chlamydophila pneumoniae NOT DETECTED NOT DETECTED Final   Mycoplasma pneumoniae DETECTED (A) NOT DETECTED Final    Comment: Performed at [facility], [address] [place], [place] [address]     Labs: Basic Metabolic Panel: Recent Labs  Lab 01/04/23 1904 01/08/23 0210 01/08/23 0609 01/09/23 0353 01/11/23 0428  NA 138 136 134* 134* 136  K 4.1 3.4* 3.4* 4.6 3.7  CL 103 99  --  100 99  CO2  --  26  --  24 27  GLUCOSE 98 121*  --  199* 107*  BUN 13 14  --  12 14  CREATININE 1.00 0.90  --  0.86 0.75  CALCIUM  --  9.7  --  8.9 8.8*  MG  --   --   --   --  2.5*   Liver Function Tests: Recent Labs  Lab 01/09/23 0353  AST 23  ALT 33  ALKPHOS 73  BILITOT 0.8  PROT 7.7  ALBUMIN 3.5   No results for input(s): "LIPASE", "AMYLASE" in the last 168 hours. No results for input(s): "AMMONIA" in the last 168 hours. CBC: Recent Labs  Lab 01/04/23 1904 01/08/23 0210 01/08/23 0609 01/09/23 0353 01/11/23 0428  WBC  --  9.2  --  10.8* 10.3  NEUTROABS  --  7.1  --   --   --   HGB 13.6 12.9* 12.6* 11.8* 11.4*  HCT 40.0 39.0 37.0* 37.2* 36.4*  MCV  --  86.9  --  90.5 91.5  PLT  --  243  --  247 270   Cardiac Enzymes: No results for input(s): "CKTOTAL", "CKMB", "CKMBINDEX", "TROPONINI" in the last 168 hours. BNP: BNP (last 3 results) Recent Labs    01/09/23 0353  BNP 52.5    ProBNP (last 3 results) No results for input(s): "PROBNP" in the last 8760 hours.  CBG: No results for input(s): "GLUCAP" in the last 168 hours.     Signed:  Ramiro Harvest MD.  [facility] 01/11/2023, 3:37 PM

## 2023-01-11 NOTE — TOC CM/SW Note (Signed)
Transition of Care [facility]) - Inpatient Brief Assessment   Patient Details  Name: Brad Edwards MRN: [id number] Date of Birth: 07/01/1985  Transition of [facility]) CM/SW Contact:    Larrie Kass, LCSW Phone Number: 01/11/2023, 3:39 PM    Transition of Care Asessment: Insurance and Status: Insurance coverage has been reviewed Patient has primary care physician: Yes Home environment has been reviewed: home with self Prior level of function:: independent Prior/Current Home Services: No current home services Social Determinants of Health Reivew: SDOH reviewed no interventions necessary Readmission risk has been reviewed: Yes Transition of care needs: no transition of care needs at this time

## 2023-01-11 NOTE — Plan of Care (Signed)

## 2023-01-11 NOTE — Progress Notes (Signed)
Patient provided discharge instructions. Patient verbalized understanding of discharge instructions. Patient's IV removed.Patient following up with PCP tomorrow, 01/12/23. Patient able to walk out of room with ease. Patient successfully discharged.

## 2023-01-11 NOTE — Progress Notes (Addendum)
SATURATION QUALIFICATIONS: (This note is used to comply with regulatory documentation for home oxygen)  Patient Saturations on Room Air at Rest =  89 -92 %  Patient Saturations on Room Air while Ambulating =  94-100%  Patient Saturations on 0 Liters of oxygen while Ambulating   Please briefly explain why patient needs home oxygen:   Patient desaturates during periods of rest/sleeping.

## 2023-01-13 LAB — CULTURE, BLOOD (ROUTINE X 2)
Culture: NO GROWTH
Culture: NO GROWTH
Special Requests: ADEQUATE
Special Requests: ADEQUATE

## 2023-07-07 ENCOUNTER — Ambulatory Visit: Admission: EM | Admit: 2023-07-07 | Discharge: 2023-07-07 | Disposition: A | Discharge status: Home / Self Care

## 2023-07-07 ENCOUNTER — Other Ambulatory Visit: Payer: Self-pay

## 2023-07-07 DIAGNOSIS — Z23 Encounter for immunization: Secondary | ICD-10-CM

## 2023-07-07 DIAGNOSIS — S91332A Puncture wound without foreign body, left foot, initial encounter: Secondary | ICD-10-CM

## 2023-07-07 MED ORDER — AMOXICILLIN-POT CLAVULANATE 875-125 MG PO TABS: 1.0000 | ORAL_TABLET | Freq: Two times a day (BID) | ORAL | 0 refills | Status: AC

## 2023-07-07 MED ORDER — TETANUS-DIPHTH-ACELL PERTUSSIS 5-2.5-18.5 LF-MCG/0.5 IM SUSY
0.5000 mL | PREFILLED_SYRINGE | Freq: Once | INTRAMUSCULAR | Status: AC
  Administered 2023-07-07: 0.5 mL via INTRAMUSCULAR

## 2023-07-07 NOTE — ED Triage Notes (Signed)
 Pt presents with complaints of left foot injury after accidentally stepping on screw (mid-foot). Pt states his last tetanus vaccine was in 2010, he believes. Pt currently denies pain sitting, although when bearing weight on left foot, pain increases to a 7/10. Pain begins to radiate to fourth toe beside the pinky toe per description. Pt states the area is not bleeding.

## 2023-07-07 NOTE — ED Provider Notes (Signed)
 [facility]    CSN: [id number] Arrival date & time: 07/07/23  1324      History   Chief Complaint Chief Complaint  Patient presents with   Foot Injury    HPI Brad Edwards is a 38 y.o. male.   HPI  He reports stepping on a nail last night at about 7-8 pm with left foot He reports he was wearing shoes during injury He does not think there are any pieces of nail remaining in foot He states Tdap is not up to date He reports lingering pain but denies current swelling or bleeding    History reviewed. No pertinent past medical history.  Patient Active Problem List   Diagnosis Date Noted   Hypoxia 01/11/2023   Multifocal pneumonia 01/08/2023   UTI (urinary tract infection) 01/08/2023   Hypokalemia 01/08/2023   Normocytic anemia 01/08/2023   Class 3 obesity 01/08/2023    Past Surgical History:  Procedure Laterality Date   LITHOTRIPSY         Home Medications    Prior to Admission medications   Medication Sig Start Date End Date Taking? Authorizing Provider  amoxicillin -clavulanate (AUGMENTIN ) 875-125 MG tablet Take 1 tablet by mouth every 12 (twelve) hours. 07/07/23  Yes Aren Cherne E, PA-C  cetirizine (ZYRTEC) 10 MG tablet Take 10 mg by mouth daily.   Yes [provider]  fluticasone  (FLONASE ) 50 MCG/ACT nasal spray Place 2 sprays into both nostrils daily for 7 days. 01/12/23 07/07/23 Yes Sebastian Toribio GAILS, MD  acetaminophen  (TYLENOL ) 500 MG tablet Take 500 mg by mouth every 6 (six) hours as needed for fever or moderate pain (pain score 4-6).    [provider]  albuterol  (VENTOLIN  HFA) 108 (90 Base) MCG/ACT inhaler Inhale 2 puffs into the lungs 2 (two) times daily for 5 days, THEN 2 puffs every 6 (six) hours as needed for wheezing or shortness of breath. 01/11/23 05/16/23  Sebastian Toribio GAILS, MD  ibuprofen (ADVIL) 200 MG tablet Take 200 mg by mouth every 6 (six) hours as needed for fever or moderate pain (pain score 4-6).    [provider]  loratadine  (CLARITIN ) 10 MG tablet Take 1 tablet (10 mg total) by mouth daily. 01/12/23   Sebastian Toribio GAILS, MD  ondansetron  (ZOFRAN -ODT) 4 MG disintegrating tablet 4mg  ODT q4 hours prn nausea/vomit 01/04/23   Floyd, Dan, DO  pantoprazole  (PROTONIX ) 40 MG tablet Take 1 tablet (40 mg total) by mouth daily at 6 (six) AM. 01/12/23   Sebastian Toribio GAILS, MD    Family History History reviewed. No pertinent family history.  Social History Social History   Tobacco Use   Smoking status: Never   Smokeless tobacco: Never  Vaping Use   Vaping status: Former  Substance Use Topics   Alcohol use: Yes    Comment: occ   Drug use: Never     Allergies   Patient has no known allergies.   Review of Systems Review of Systems  Skin:  Positive for wound.     Physical Exam Triage Vital Signs ED Triage Vitals  Encounter Vitals Group     BP 07/07/23 1425 (!) 149/83     Systolic BP Percentile --      Diastolic BP Percentile --      Pulse Rate 07/07/23 1425 82     Resp 07/07/23 1425 20     Temp 07/07/23 1425 98.2 F (36.8 C)     Temp Source 07/07/23 1425 Oral  SpO2 07/07/23 1425 95 %     Weight 07/07/23 1427 300 lb (136.1 kg)     Height 07/07/23 1427 5' 11 (1.803 m)     Head Circumference --      Peak Flow --      Pain Score 07/07/23 1426 0     Pain Loc --      Pain Education --      Exclude from Growth Chart --    No data found.  Updated Vital Signs BP (!) 149/83 (BP Location: Right Arm)   Pulse 82   Temp 98.2 F (36.8 C) (Oral)   Resp 20   Ht 5' 11 (1.803 m)   Wt 300 lb (136.1 kg)   SpO2 95%   BMI 41.84 kg/m   Visual Acuity Right Eye Distance:   Left Eye Distance:   Bilateral Distance:    Right Eye Near:   Left Eye Near:    Bilateral Near:     Physical Exam Vitals reviewed.  Constitutional:      General: He is awake.     Appearance: Normal appearance. He is well-developed and well-groomed.  HENT:     Head: Normocephalic and atraumatic.   Cardiovascular:     Pulses:          Dorsalis pedis pulses are 2+ on the left side.  Musculoskeletal:     Left foot: Normal range of motion. No deformity.       Feet:  Feet:     Left foot:     Skin integrity: No ulcer, blister, skin breakdown, erythema, warmth, callus or fissure.     Toenail Condition: Left toenails are normal.     Comments: Patient has small puncture wound to the sole of the left foot.  Area appears clean and dry with no signs of swelling, erythema or purulent drainage. Skin:    General: Skin is warm and dry.  Neurological:     General: No focal deficit present.     Mental Status: He is alert and oriented to person, place, and time.  Psychiatric:        Mood and Affect: Mood normal.        Behavior: Behavior normal. Behavior is cooperative.        Thought Content: Thought content normal.        Judgment: Judgment normal.      UC Treatments / Results  Labs (all labs ordered are listed, but only abnormal results are displayed) Labs Reviewed - No data to display  EKG   Radiology No results found.  Procedures Procedures (including critical care time)  Medications Ordered in UC Medications  Tdap (BOOSTRIX) injection 0.5 mL (0.5 mLs Intramuscular Given 07/07/23 1550)    Initial Impression / Assessment and Plan / UC Course  I have reviewed the triage vital signs and the nursing notes.  Pertinent labs & imaging results that were available during my care of the patient were reviewed by me and considered in my medical decision making (see chart for details).      Final Clinical Impressions(s) / UC Diagnoses   Final diagnoses:  Puncture wound of left foot, initial encounter   Patient presents today with concerns for puncture wound to the left foot.  He reports stepping on a nail last night while wearing shoes and sustaining a puncture wound to the sole of the left foot.  He does report some mild tension when flexing or bending the toes particularly  the fourth digit  but range of motion, plantar and dorsiflexion are all intact.  Will give patient tetanus booster today given the fact that his last 1 appears to have been in 2010.  Will also provide Augmentin  p.o. twice daily x 7 days to help prevent against potential infection given likely contamination from nail going through shoes.  Recommend Tylenol  and ibuprofen as needed for pain management.  ED and return precautions reviewed and provided in after visit summary.  Follow-up as needed.    Discharge Instructions      You were seen today for concerns for a puncture wound to your left foot. Your wound appears to be clean and does not show signs of obvious infection at this time.  We have updated your tetanus booster to help prevent complications.  I have also sent in an antibiotic called Augmentin  for you to take twice per day for 7 days.  Please make sure that you are keeping the area clean and dry and covering it with a bandage per your preference.  You can take Tylenol  and ibuprofen as needed for pain management.  If at any point you start to notice severe swelling, redness, difficulty moving your foot or bending your toes, fever or chills, drainage that looks like pus please either return to urgent care or go to the emergency room as these could be signs of severe complications.     ED Prescriptions     Medication Sig Dispense Auth. Provider   amoxicillin -clavulanate (AUGMENTIN ) 875-125 MG tablet Take 1 tablet by mouth every 12 (twelve) hours. 14 tablet Azar [place] E, PA-C      PDMP not reviewed this encounter.   Brad Rocky BRAVO, PA-C 07/07/23 1746

## 2023-07-07 NOTE — Discharge Instructions (Addendum)
 You were seen today for concerns for a puncture wound to your left foot. Your wound appears to be clean and does not show signs of obvious infection at this time.  We have updated your tetanus booster to help prevent complications.  I have also sent in an antibiotic called Augmentin  for you to take twice per day for 7 days.  Please make sure that you are keeping the area clean and dry and covering it with a bandage per your preference.  You can take Tylenol  and ibuprofen as needed for pain management.  If at any point you start to notice severe swelling, redness, difficulty moving your foot or bending your toes, fever or chills, drainage that looks like pus please either return to urgent care or go to the emergency room as these could be signs of severe complications.
